# Patient Record
Sex: Male | Born: 1935 | Race: White | Hispanic: No | Marital: Married | Smoking: Former smoker
Health system: Southern US, Community
[De-identification: ages and names within clinical notes are randomized; demographics above are authoritative.]

## PROBLEM LIST (undated history)

## (undated) DIAGNOSIS — Z7901 Long term (current) use of anticoagulants: Secondary | ICD-10-CM

## (undated) DIAGNOSIS — I10 Essential (primary) hypertension: Secondary | ICD-10-CM

## (undated) DIAGNOSIS — E785 Hyperlipidemia, unspecified: Secondary | ICD-10-CM

## (undated) DIAGNOSIS — I480 Paroxysmal atrial fibrillation: Secondary | ICD-10-CM

## (undated) DIAGNOSIS — I447 Left bundle-branch block, unspecified: Secondary | ICD-10-CM

## (undated) DIAGNOSIS — N400 Enlarged prostate without lower urinary tract symptoms: Secondary | ICD-10-CM

## (undated) DIAGNOSIS — Z8719 Personal history of other diseases of the digestive system: Secondary | ICD-10-CM

## (undated) DIAGNOSIS — G5600 Carpal tunnel syndrome, unspecified upper limb: Secondary | ICD-10-CM

## (undated) DIAGNOSIS — N481 Balanitis: Secondary | ICD-10-CM

## (undated) DIAGNOSIS — Z8679 Personal history of other diseases of the circulatory system: Secondary | ICD-10-CM

## (undated) HISTORY — DX: Left bundle-branch block, unspecified: I44.7

## (undated) HISTORY — DX: Carpal tunnel syndrome, unspecified upper limb: G56.00

## (undated) HISTORY — DX: Personal history of other diseases of the circulatory system: Z86.79

## (undated) HISTORY — DX: Balanitis: N48.1

## (undated) HISTORY — DX: Long term (current) use of anticoagulants: Z79.01

## (undated) HISTORY — DX: Benign prostatic hyperplasia without lower urinary tract symptoms: N40.0

## (undated) HISTORY — DX: Paroxysmal atrial fibrillation: I48.0

## (undated) HISTORY — DX: Personal history of other diseases of the digestive system: Z87.19

## (undated) HISTORY — DX: Essential (primary) hypertension: I10

## (undated) HISTORY — PX: CARPAL TUNNEL RELEASE: SHX101

## (undated) HISTORY — DX: Hyperlipidemia, unspecified: E78.5

---

## 1998-09-25 ENCOUNTER — Other Ambulatory Visit: Admission: RE | Admit: 1998-09-25 | Discharge: 1998-09-25 | Payer: Self-pay | Admitting: Urology

## 1999-08-24 ENCOUNTER — Emergency Department (HOSPITAL_COMMUNITY): Admission: EM | Admit: 1999-08-24 | Discharge: 1999-08-24 | Payer: Self-pay | Admitting: Emergency Medicine

## 2000-08-02 ENCOUNTER — Encounter: Payer: Self-pay | Admitting: Emergency Medicine

## 2000-08-02 ENCOUNTER — Emergency Department (HOSPITAL_COMMUNITY): Admission: EM | Admit: 2000-08-02 | Discharge: 2000-08-02 | Payer: Self-pay | Admitting: Emergency Medicine

## 2002-07-25 ENCOUNTER — Encounter: Payer: Self-pay | Admitting: Internal Medicine

## 2002-07-25 ENCOUNTER — Encounter: Admission: RE | Admit: 2002-07-25 | Discharge: 2002-07-25 | Payer: Self-pay | Admitting: Internal Medicine

## 2002-07-26 HISTORY — PX: LUMBAR LAMINECTOMY/DECOMPRESSION MICRODISCECTOMY: SHX5026

## 2002-10-01 LAB — HM COLONOSCOPY

## 2002-10-08 ENCOUNTER — Encounter: Payer: Self-pay | Admitting: Neurosurgery

## 2002-10-12 ENCOUNTER — Inpatient Hospital Stay (HOSPITAL_COMMUNITY): Admission: RE | Admit: 2002-10-12 | Discharge: 2002-10-15 | Payer: Self-pay | Admitting: Neurosurgery

## 2002-10-12 ENCOUNTER — Encounter: Payer: Self-pay | Admitting: Neurosurgery

## 2002-12-04 ENCOUNTER — Encounter: Admission: RE | Admit: 2002-12-04 | Discharge: 2002-12-04 | Payer: Self-pay | Admitting: Neurosurgery

## 2002-12-04 ENCOUNTER — Encounter: Payer: Self-pay | Admitting: Neurosurgery

## 2003-03-05 ENCOUNTER — Encounter: Payer: Self-pay | Admitting: Neurosurgery

## 2003-03-05 ENCOUNTER — Encounter: Admission: RE | Admit: 2003-03-05 | Discharge: 2003-03-05 | Payer: Self-pay | Admitting: Neurosurgery

## 2003-09-03 ENCOUNTER — Encounter: Admission: RE | Admit: 2003-09-03 | Discharge: 2003-09-03 | Payer: Self-pay | Admitting: Neurosurgery

## 2004-06-04 ENCOUNTER — Ambulatory Visit: Payer: Self-pay | Admitting: Cardiology

## 2004-06-10 ENCOUNTER — Ambulatory Visit: Payer: Self-pay | Admitting: Cardiovascular Disease

## 2004-07-01 ENCOUNTER — Ambulatory Visit: Payer: Self-pay | Admitting: *Deleted

## 2004-07-09 ENCOUNTER — Ambulatory Visit: Payer: Self-pay | Admitting: *Deleted

## 2004-07-23 ENCOUNTER — Ambulatory Visit: Payer: Self-pay | Admitting: Cardiovascular Disease

## 2004-07-26 HISTORY — PX: CIRCUMCISION: SHX1350

## 2004-07-29 ENCOUNTER — Ambulatory Visit: Payer: Self-pay | Admitting: *Deleted

## 2004-08-07 ENCOUNTER — Ambulatory Visit: Payer: Self-pay | Admitting: Cardiovascular Disease

## 2004-08-26 ENCOUNTER — Ambulatory Visit: Payer: Self-pay | Admitting: Cardiology

## 2004-09-10 ENCOUNTER — Ambulatory Visit: Payer: Self-pay | Admitting: Internal Medicine

## 2004-09-23 ENCOUNTER — Ambulatory Visit: Payer: Self-pay | Admitting: Cardiology

## 2004-10-15 ENCOUNTER — Ambulatory Visit: Payer: Self-pay | Admitting: Internal Medicine

## 2004-10-21 ENCOUNTER — Ambulatory Visit: Payer: Self-pay | Admitting: *Deleted

## 2004-11-18 ENCOUNTER — Ambulatory Visit: Payer: Self-pay | Admitting: Cardiology

## 2004-12-16 ENCOUNTER — Ambulatory Visit: Payer: Self-pay | Admitting: *Deleted

## 2004-12-22 ENCOUNTER — Ambulatory Visit: Payer: Self-pay | Admitting: Cardiology

## 2005-01-07 ENCOUNTER — Encounter: Admission: RE | Admit: 2005-01-07 | Discharge: 2005-01-07 | Payer: Self-pay | Admitting: Urology

## 2005-01-12 ENCOUNTER — Ambulatory Visit (HOSPITAL_COMMUNITY): Admission: RE | Admit: 2005-01-12 | Discharge: 2005-01-12 | Payer: Self-pay | Admitting: Urology

## 2005-01-12 ENCOUNTER — Ambulatory Visit (HOSPITAL_BASED_OUTPATIENT_CLINIC_OR_DEPARTMENT_OTHER): Admission: RE | Admit: 2005-01-12 | Discharge: 2005-01-12 | Payer: Self-pay | Admitting: Urology

## 2005-01-20 ENCOUNTER — Ambulatory Visit: Payer: Self-pay | Admitting: Cardiovascular Disease

## 2005-02-03 ENCOUNTER — Ambulatory Visit: Payer: Self-pay | Admitting: Cardiology

## 2005-03-03 ENCOUNTER — Ambulatory Visit: Payer: Self-pay | Admitting: Cardiology

## 2005-03-31 ENCOUNTER — Ambulatory Visit: Payer: Self-pay | Admitting: Cardiovascular Disease

## 2005-04-14 ENCOUNTER — Ambulatory Visit: Payer: Self-pay | Admitting: Cardiology

## 2005-04-28 ENCOUNTER — Ambulatory Visit: Payer: Self-pay | Admitting: Cardiology

## 2005-05-26 ENCOUNTER — Ambulatory Visit: Payer: Self-pay | Admitting: Cardiology

## 2005-06-23 ENCOUNTER — Ambulatory Visit: Payer: Self-pay | Admitting: Cardiology

## 2005-06-25 ENCOUNTER — Ambulatory Visit: Payer: Self-pay | Admitting: Internal Medicine

## 2005-07-14 ENCOUNTER — Ambulatory Visit: Payer: Self-pay | Admitting: Cardiology

## 2005-08-11 ENCOUNTER — Ambulatory Visit: Payer: Self-pay | Admitting: Cardiovascular Disease

## 2005-08-16 ENCOUNTER — Ambulatory Visit: Payer: Self-pay | Admitting: Cardiology

## 2005-08-30 ENCOUNTER — Ambulatory Visit (HOSPITAL_COMMUNITY): Admission: RE | Admit: 2005-08-30 | Discharge: 2005-08-30 | Payer: Self-pay | Admitting: Neurosurgery

## 2005-09-08 ENCOUNTER — Ambulatory Visit: Payer: Self-pay | Admitting: Cardiology

## 2005-10-06 ENCOUNTER — Ambulatory Visit: Payer: Self-pay | Admitting: Cardiology

## 2005-11-01 ENCOUNTER — Ambulatory Visit: Payer: Self-pay | Admitting: Internal Medicine

## 2005-11-03 ENCOUNTER — Ambulatory Visit: Payer: Self-pay | Admitting: Cardiology

## 2005-11-04 ENCOUNTER — Ambulatory Visit: Payer: Self-pay | Admitting: Internal Medicine

## 2005-12-02 ENCOUNTER — Ambulatory Visit: Payer: Self-pay | Admitting: Cardiology

## 2005-12-16 ENCOUNTER — Ambulatory Visit: Payer: Self-pay | Admitting: Internal Medicine

## 2005-12-22 ENCOUNTER — Ambulatory Visit (HOSPITAL_COMMUNITY): Admission: RE | Admit: 2005-12-22 | Discharge: 2005-12-22 | Payer: Self-pay | Admitting: Neurosurgery

## 2005-12-28 ENCOUNTER — Ambulatory Visit: Payer: Self-pay | Admitting: Internal Medicine

## 2006-01-11 ENCOUNTER — Ambulatory Visit: Payer: Self-pay | Admitting: Cardiology

## 2006-02-01 ENCOUNTER — Ambulatory Visit: Payer: Self-pay | Admitting: *Deleted

## 2006-02-01 ENCOUNTER — Ambulatory Visit: Payer: Self-pay | Admitting: Cardiology

## 2006-02-15 ENCOUNTER — Ambulatory Visit: Payer: Self-pay | Admitting: *Deleted

## 2006-03-21 ENCOUNTER — Ambulatory Visit: Payer: Self-pay | Admitting: Cardiology

## 2006-04-05 ENCOUNTER — Ambulatory Visit: Payer: Self-pay | Admitting: Cardiology

## 2006-04-26 ENCOUNTER — Ambulatory Visit: Payer: Self-pay | Admitting: Cardiovascular Disease

## 2006-05-10 ENCOUNTER — Ambulatory Visit: Payer: Self-pay | Admitting: Cardiology

## 2006-05-31 ENCOUNTER — Ambulatory Visit: Payer: Self-pay | Admitting: Cardiology

## 2006-06-28 ENCOUNTER — Ambulatory Visit: Payer: Self-pay | Admitting: Internal Medicine

## 2006-07-07 ENCOUNTER — Encounter: Admission: RE | Admit: 2006-07-07 | Discharge: 2006-07-07 | Payer: Self-pay | Admitting: Family Medicine

## 2006-07-09 ENCOUNTER — Encounter: Admission: RE | Admit: 2006-07-09 | Discharge: 2006-07-09 | Payer: Self-pay | Admitting: Family Medicine

## 2006-07-22 ENCOUNTER — Ambulatory Visit: Payer: Self-pay | Admitting: Cardiology

## 2006-07-26 HISTORY — PX: KNEE ARTHROSCOPY: SHX127

## 2006-07-29 ENCOUNTER — Ambulatory Visit (HOSPITAL_COMMUNITY): Admission: RE | Admit: 2006-07-29 | Discharge: 2006-07-29 | Payer: Self-pay | Admitting: Orthopaedic Surgery

## 2006-08-08 ENCOUNTER — Ambulatory Visit: Payer: Self-pay | Admitting: Internal Medicine

## 2006-09-07 ENCOUNTER — Ambulatory Visit: Payer: Self-pay | Admitting: Cardiology

## 2006-09-28 ENCOUNTER — Ambulatory Visit: Payer: Self-pay | Admitting: Cardiology

## 2006-10-26 ENCOUNTER — Ambulatory Visit: Payer: Self-pay | Admitting: Cardiology

## 2006-11-07 ENCOUNTER — Ambulatory Visit: Payer: Self-pay | Admitting: Internal Medicine

## 2006-11-07 LAB — CONVERTED CEMR LAB
AST: 22 units/L (ref 0–37)
Bilirubin, Direct: 0.1 mg/dL (ref 0.0–0.3)
CO2: 32 meq/L (ref 19–32)
Chloride: 111 meq/L (ref 96–112)
Creatinine, Ser: 0.9 mg/dL (ref 0.4–1.5)
GFR calc non Af Amer: 86 mL/min
Glucose, Bld: 96 mg/dL (ref 70–99)
HDL: 31.9 mg/dL — ABNORMAL LOW (ref >39.0)
PSA: 2.46 ng/mL (ref 0.10–4.00)
Sodium: 147 meq/L — ABNORMAL HIGH (ref 135–145)
Total Bilirubin: 0.6 mg/dL (ref 0.3–1.2)
Total CHOL/HDL Ratio: 4.7
Total Protein: 6.9 g/dL (ref 6.0–8.3)
Triglycerides: 83 mg/dL (ref 0–149)
VLDL: 17 mg/dL (ref 0–40)

## 2006-11-25 ENCOUNTER — Ambulatory Visit: Payer: Self-pay | Admitting: Internal Medicine

## 2006-12-26 ENCOUNTER — Ambulatory Visit: Payer: Self-pay | Admitting: Internal Medicine

## 2007-01-24 ENCOUNTER — Ambulatory Visit: Payer: Self-pay | Admitting: Cardiology

## 2007-02-01 ENCOUNTER — Ambulatory Visit: Payer: Self-pay | Admitting: Cardiology

## 2007-02-01 LAB — CONVERTED CEMR LAB
Basophils Absolute: 0 10*3/uL (ref 0.0–0.1)
Eosinophils Absolute: 0.2 10*3/uL (ref 0.0–0.6)
HCT: 40.1 % (ref 39.0–52.0)
Hemoglobin: 14.3 g/dL (ref 13.0–17.0)
Lymphocytes Relative: 27.4 % (ref 12.0–46.0)
MCHC: 35.5 g/dL (ref 30.0–36.0)
Monocytes Absolute: 0.7 10*3/uL (ref 0.2–0.7)
Monocytes Relative: 12.3 % — ABNORMAL HIGH (ref 3.0–11.0)
Neutro Abs: 3.4 10*3/uL (ref 1.4–7.7)
Neutrophils Relative %: 57.4 % (ref 43.0–77.0)
RDW: 12.8 % (ref 11.5–14.6)

## 2007-02-24 ENCOUNTER — Ambulatory Visit: Payer: Self-pay | Admitting: Cardiology

## 2007-03-28 ENCOUNTER — Ambulatory Visit: Payer: Self-pay | Admitting: Internal Medicine

## 2007-04-26 ENCOUNTER — Ambulatory Visit: Payer: Self-pay | Admitting: Cardiology

## 2007-05-09 ENCOUNTER — Ambulatory Visit: Payer: Self-pay | Admitting: Internal Medicine

## 2007-05-23 ENCOUNTER — Encounter: Payer: Self-pay | Admitting: Internal Medicine

## 2007-05-23 DIAGNOSIS — I495 Sick sinus syndrome: Secondary | ICD-10-CM

## 2007-05-23 DIAGNOSIS — K5730 Diverticulosis of large intestine without perforation or abscess without bleeding: Secondary | ICD-10-CM | POA: Insufficient documentation

## 2007-05-23 DIAGNOSIS — I4891 Unspecified atrial fibrillation: Secondary | ICD-10-CM | POA: Insufficient documentation

## 2007-05-23 DIAGNOSIS — I10 Essential (primary) hypertension: Secondary | ICD-10-CM | POA: Insufficient documentation

## 2007-05-24 ENCOUNTER — Ambulatory Visit: Payer: Self-pay | Admitting: Cardiology

## 2007-06-21 ENCOUNTER — Ambulatory Visit: Payer: Self-pay | Admitting: Cardiology

## 2007-06-27 ENCOUNTER — Ambulatory Visit: Payer: Self-pay | Admitting: Internal Medicine

## 2007-06-27 DIAGNOSIS — K625 Hemorrhage of anus and rectum: Secondary | ICD-10-CM

## 2007-07-18 ENCOUNTER — Ambulatory Visit: Payer: Self-pay | Admitting: Cardiology

## 2007-08-15 ENCOUNTER — Ambulatory Visit: Payer: Self-pay | Admitting: Cardiology

## 2007-09-12 ENCOUNTER — Ambulatory Visit: Payer: Self-pay | Admitting: Cardiovascular Disease

## 2007-10-05 ENCOUNTER — Ambulatory Visit: Payer: Self-pay | Admitting: Cardiology

## 2007-10-27 ENCOUNTER — Ambulatory Visit: Payer: Self-pay | Admitting: Internal Medicine

## 2007-11-13 ENCOUNTER — Ambulatory Visit: Payer: Self-pay | Admitting: Internal Medicine

## 2007-11-13 LAB — CONVERTED CEMR LAB
AST: 24 units/L (ref 0–37)
Albumin: 3.7 g/dL (ref 3.5–5.2)
Alkaline Phosphatase: 48 units/L (ref 39–117)
BUN: 18 mg/dL (ref 6–23)
Basophils Absolute: 0 10*3/uL (ref 0.0–0.1)
Chloride: 112 meq/L (ref 96–112)
Cholesterol: 168 mg/dL (ref 0–200)
Creatinine, Ser: 0.9 mg/dL (ref 0.4–1.5)
Eosinophils Absolute: 0.2 10*3/uL (ref 0.0–0.7)
Glucose, Bld: 95 mg/dL (ref 70–99)
HCT: 41.3 % (ref 39.0–52.0)
HDL: 24.6 mg/dL — ABNORMAL LOW (ref >39.0)
MCHC: 33.7 g/dL (ref 30.0–36.0)
Monocytes Absolute: 0.5 10*3/uL (ref 0.1–1.0)
Monocytes Relative: 11.6 % (ref 3.0–12.0)
Neutro Abs: 2.6 10*3/uL (ref 1.4–7.7)
PSA: 3.05 ng/mL (ref 0.10–4.00)
Platelets: 146 10*3/uL — ABNORMAL LOW (ref 150–400)
Potassium: 4.5 meq/L (ref 3.5–5.1)
RDW: 12.9 % (ref 11.5–14.6)
Sodium: 146 meq/L — ABNORMAL HIGH (ref 135–145)
Total CHOL/HDL Ratio: 6.8
Total Protein: 6.3 g/dL (ref 6.0–8.3)
Triglycerides: 98 mg/dL (ref 0–149)

## 2007-11-24 ENCOUNTER — Ambulatory Visit: Payer: Self-pay | Admitting: Cardiology

## 2007-12-25 ENCOUNTER — Ambulatory Visit: Payer: Self-pay | Admitting: Cardiology

## 2008-01-24 ENCOUNTER — Ambulatory Visit: Payer: Self-pay | Admitting: Cardiology

## 2008-01-25 ENCOUNTER — Ambulatory Visit: Payer: Self-pay | Admitting: Cardiology

## 2008-02-27 ENCOUNTER — Ambulatory Visit: Payer: Self-pay | Admitting: Cardiology

## 2008-04-02 ENCOUNTER — Ambulatory Visit: Payer: Self-pay | Admitting: Cardiology

## 2008-04-16 ENCOUNTER — Ambulatory Visit: Payer: Self-pay | Admitting: Internal Medicine

## 2008-04-30 ENCOUNTER — Ambulatory Visit: Payer: Self-pay | Admitting: Cardiology

## 2008-05-28 ENCOUNTER — Ambulatory Visit: Payer: Self-pay | Admitting: Internal Medicine

## 2008-06-12 ENCOUNTER — Ambulatory Visit: Payer: Self-pay | Admitting: Internal Medicine

## 2008-06-12 DIAGNOSIS — L259 Unspecified contact dermatitis, unspecified cause: Secondary | ICD-10-CM | POA: Insufficient documentation

## 2008-06-19 ENCOUNTER — Ambulatory Visit: Payer: Self-pay | Admitting: Cardiovascular Disease

## 2008-06-21 ENCOUNTER — Ambulatory Visit: Payer: Self-pay | Admitting: Internal Medicine

## 2008-07-17 ENCOUNTER — Ambulatory Visit: Payer: Self-pay | Admitting: Internal Medicine

## 2008-07-17 ENCOUNTER — Ambulatory Visit: Payer: Self-pay | Admitting: Cardiology

## 2008-08-14 ENCOUNTER — Ambulatory Visit: Payer: Self-pay | Admitting: Cardiovascular Disease

## 2008-08-22 ENCOUNTER — Ambulatory Visit: Payer: Self-pay | Admitting: Internal Medicine

## 2008-08-22 LAB — CONVERTED CEMR LAB
Chloride: 108 meq/L (ref 96–112)
GFR calc Af Amer: 104 mL/min
GFR calc non Af Amer: 86 mL/min
Potassium: 4.7 meq/L (ref 3.5–5.1)
Sodium: 142 meq/L (ref 135–145)

## 2008-09-03 ENCOUNTER — Ambulatory Visit: Payer: Self-pay | Admitting: Internal Medicine

## 2008-09-11 ENCOUNTER — Ambulatory Visit: Payer: Self-pay | Admitting: Cardiology

## 2008-09-11 ENCOUNTER — Ambulatory Visit: Payer: Self-pay | Admitting: Internal Medicine

## 2008-09-16 ENCOUNTER — Encounter: Payer: Self-pay | Admitting: Internal Medicine

## 2008-09-16 DIAGNOSIS — D696 Thrombocytopenia, unspecified: Secondary | ICD-10-CM

## 2008-09-16 LAB — CONVERTED CEMR LAB
Bilirubin, Direct: 0.2 mg/dL (ref 0.0–0.3)
Eosinophils Relative: 3.2 % (ref 0.0–5.0)
HCT: 42.9 % (ref 39.0–52.0)
HDL: 26.9 mg/dL — ABNORMAL LOW (ref >39.0)
Lymphocytes Relative: 26 % (ref 12.0–46.0)
TSH: 4.9 microintl units/mL (ref 0.35–5.50)
Total Bilirubin: 0.8 mg/dL (ref 0.3–1.2)
Triglycerides: 124 mg/dL (ref 0–149)
WBC: 5.9 10*3/uL (ref 4.5–10.5)

## 2008-09-20 ENCOUNTER — Ambulatory Visit (HOSPITAL_BASED_OUTPATIENT_CLINIC_OR_DEPARTMENT_OTHER): Admission: RE | Admit: 2008-09-20 | Discharge: 2008-09-20 | Payer: Self-pay | Admitting: Internal Medicine

## 2008-09-20 ENCOUNTER — Ambulatory Visit: Payer: Self-pay | Admitting: Diagnostic Radiology

## 2008-09-20 ENCOUNTER — Ambulatory Visit: Payer: Self-pay | Admitting: Internal Medicine

## 2008-09-20 DIAGNOSIS — M25559 Pain in unspecified hip: Secondary | ICD-10-CM

## 2008-10-09 ENCOUNTER — Ambulatory Visit: Payer: Self-pay | Admitting: Cardiology

## 2008-11-06 ENCOUNTER — Ambulatory Visit: Payer: Self-pay | Admitting: Internal Medicine

## 2008-11-06 ENCOUNTER — Ambulatory Visit: Payer: Self-pay | Admitting: Cardiovascular Disease

## 2008-11-06 LAB — CONVERTED CEMR LAB
Basophils Relative: 1.1 % (ref 0.0–3.0)
Eosinophils Relative: 2.4 % (ref 0.0–5.0)
HCT: 41.9 % (ref 39.0–52.0)
Lymphs Abs: 1.5 10*3/uL (ref 0.7–4.0)
Monocytes Absolute: 0.6 10*3/uL (ref 0.1–1.0)
Neutrophils Relative %: 61.7 % (ref 43.0–77.0)
WBC: 6 10*3/uL (ref 4.5–10.5)

## 2008-11-11 ENCOUNTER — Telehealth: Payer: Self-pay | Admitting: Internal Medicine

## 2008-11-27 ENCOUNTER — Ambulatory Visit: Payer: Self-pay | Admitting: Cardiology

## 2008-12-09 ENCOUNTER — Encounter (INDEPENDENT_AMBULATORY_CARE_PROVIDER_SITE_OTHER): Payer: Self-pay | Admitting: *Deleted

## 2008-12-24 ENCOUNTER — Encounter: Payer: Self-pay | Admitting: *Deleted

## 2008-12-25 ENCOUNTER — Ambulatory Visit: Payer: Self-pay | Admitting: Cardiology

## 2008-12-25 LAB — CONVERTED CEMR LAB
POC INR: 1.7
Protime: 16.2

## 2009-01-23 ENCOUNTER — Ambulatory Visit: Payer: Self-pay | Admitting: Cardiology

## 2009-01-23 ENCOUNTER — Ambulatory Visit: Payer: Self-pay | Admitting: Internal Medicine

## 2009-01-29 ENCOUNTER — Encounter: Payer: Self-pay | Admitting: *Deleted

## 2009-02-05 ENCOUNTER — Encounter (INDEPENDENT_AMBULATORY_CARE_PROVIDER_SITE_OTHER): Payer: Self-pay | Admitting: Cardiology

## 2009-02-05 ENCOUNTER — Ambulatory Visit: Payer: Self-pay

## 2009-02-05 ENCOUNTER — Ambulatory Visit: Payer: Self-pay | Admitting: Cardiology

## 2009-02-05 ENCOUNTER — Encounter: Payer: Self-pay | Admitting: Cardiology

## 2009-02-05 LAB — CONVERTED CEMR LAB: Prothrombin Time: 18.8 s

## 2009-03-04 ENCOUNTER — Ambulatory Visit: Payer: Self-pay | Admitting: Internal Medicine

## 2009-03-04 LAB — CONVERTED CEMR LAB: POC INR: 2.9

## 2009-03-05 ENCOUNTER — Ambulatory Visit: Payer: Self-pay | Admitting: Internal Medicine

## 2009-03-05 DIAGNOSIS — E785 Hyperlipidemia, unspecified: Secondary | ICD-10-CM | POA: Insufficient documentation

## 2009-03-06 ENCOUNTER — Ambulatory Visit: Payer: Self-pay | Admitting: Cardiology

## 2009-03-07 ENCOUNTER — Telehealth: Payer: Self-pay | Admitting: Internal Medicine

## 2009-03-07 LAB — CONVERTED CEMR LAB
BUN: 16 mg/dL (ref 6–23)
Basophils Absolute: 0 10*3/uL (ref 0.0–0.1)
Basophils Relative: 0.3 % (ref 0.0–3.0)
Eosinophils Absolute: 0.2 10*3/uL (ref 0.0–0.7)
GFR calc non Af Amer: 98.21 mL/min (ref >60)
Glucose, Bld: 87 mg/dL (ref 70–99)
LDL Cholesterol: 122 mg/dL — ABNORMAL HIGH (ref 0–99)
Lymphocytes Relative: 29.7 % (ref 12.0–46.0)
MCHC: 34.5 g/dL (ref 30.0–36.0)
MCV: 93.4 fL (ref 78.0–100.0)
Monocytes Absolute: 0.4 10*3/uL (ref 0.1–1.0)
Neutrophils Relative %: 60.3 % (ref 43.0–77.0)
Platelets: 150 10*3/uL (ref 150.0–400.0)
Potassium: 4.1 meq/L (ref 3.5–5.1)
RBC: 4.33 M/uL (ref 4.22–5.81)
VLDL: 19.2 mg/dL (ref 0.0–40.0)

## 2009-03-10 ENCOUNTER — Telehealth: Payer: Self-pay | Admitting: Internal Medicine

## 2009-03-10 ENCOUNTER — Encounter: Payer: Self-pay | Admitting: Internal Medicine

## 2009-04-01 ENCOUNTER — Ambulatory Visit: Payer: Self-pay | Admitting: Cardiology

## 2009-04-01 LAB — CONVERTED CEMR LAB: POC INR: 2.8

## 2009-04-29 ENCOUNTER — Ambulatory Visit: Payer: Self-pay | Admitting: Cardiology

## 2009-04-29 LAB — CONVERTED CEMR LAB: POC INR: 2.2

## 2009-05-26 ENCOUNTER — Telehealth (INDEPENDENT_AMBULATORY_CARE_PROVIDER_SITE_OTHER): Payer: Self-pay | Admitting: *Deleted

## 2009-05-27 ENCOUNTER — Ambulatory Visit: Payer: Self-pay | Admitting: Cardiology

## 2009-05-27 LAB — CONVERTED CEMR LAB: POC INR: 2.3

## 2009-06-26 ENCOUNTER — Ambulatory Visit: Payer: Self-pay | Admitting: Cardiology

## 2009-07-28 ENCOUNTER — Encounter (INDEPENDENT_AMBULATORY_CARE_PROVIDER_SITE_OTHER): Payer: Self-pay | Admitting: Cardiology

## 2009-07-28 ENCOUNTER — Ambulatory Visit: Payer: Self-pay | Admitting: Cardiology

## 2009-07-28 LAB — CONVERTED CEMR LAB: POC INR: 1.4

## 2009-08-19 ENCOUNTER — Ambulatory Visit: Payer: Self-pay | Admitting: Internal Medicine

## 2009-08-19 LAB — CONVERTED CEMR LAB
BUN: 18 mg/dL (ref 6–23)
Basophils Absolute: 0 10*3/uL (ref 0.0–0.1)
Calcium: 9.3 mg/dL (ref 8.4–10.5)
Creatinine, Ser: 0.9 mg/dL (ref 0.4–1.5)
Eosinophils Absolute: 0.2 10*3/uL (ref 0.0–0.7)
GFR calc non Af Amer: 85.64 mL/min (ref >60)
Glucose, Bld: 69 mg/dL — ABNORMAL LOW (ref 70–99)
HCT: 39.3 % (ref 39.0–52.0)
Hemoglobin: 13.1 g/dL (ref 13.0–17.0)
LDL Cholesterol: 110 mg/dL — ABNORMAL HIGH (ref 0–99)
Lymphocytes Relative: 29 % (ref 12.0–46.0)
Lymphs Abs: 1.4 10*3/uL (ref 0.7–4.0)
MCHC: 33.3 g/dL (ref 30.0–36.0)
Neutro Abs: 2.8 10*3/uL (ref 1.4–7.7)
RDW: 13 % (ref 11.5–14.6)
Triglycerides: 62 mg/dL (ref 0.0–149.0)

## 2009-08-26 ENCOUNTER — Ambulatory Visit: Payer: Self-pay | Admitting: Internal Medicine

## 2009-09-04 ENCOUNTER — Ambulatory Visit: Payer: Self-pay | Admitting: Internal Medicine

## 2009-09-09 ENCOUNTER — Ambulatory Visit: Payer: Self-pay | Admitting: Internal Medicine

## 2009-10-09 ENCOUNTER — Ambulatory Visit: Payer: Self-pay | Admitting: Internal Medicine

## 2009-10-09 ENCOUNTER — Ambulatory Visit: Payer: Self-pay | Admitting: Cardiology

## 2009-10-13 ENCOUNTER — Encounter: Payer: Self-pay | Admitting: Internal Medicine

## 2009-11-11 ENCOUNTER — Ambulatory Visit: Payer: Self-pay | Admitting: Cardiovascular Disease

## 2009-11-11 LAB — CONVERTED CEMR LAB: POC INR: 1.9

## 2009-12-09 ENCOUNTER — Ambulatory Visit: Payer: Self-pay | Admitting: Cardiology

## 2009-12-09 LAB — CONVERTED CEMR LAB: POC INR: 2.2

## 2009-12-26 ENCOUNTER — Encounter: Payer: Self-pay | Admitting: Internal Medicine

## 2010-01-12 ENCOUNTER — Ambulatory Visit: Payer: Self-pay | Admitting: Cardiology

## 2010-01-12 LAB — CONVERTED CEMR LAB: POC INR: 2.1

## 2010-01-19 ENCOUNTER — Encounter: Payer: Self-pay | Admitting: Internal Medicine

## 2010-02-09 ENCOUNTER — Ambulatory Visit: Payer: Self-pay | Admitting: Cardiovascular Disease

## 2010-03-09 ENCOUNTER — Ambulatory Visit: Payer: Self-pay | Admitting: Cardiology

## 2010-03-09 LAB — CONVERTED CEMR LAB: POC INR: 2.1

## 2010-04-01 ENCOUNTER — Ambulatory Visit: Payer: Self-pay | Admitting: Internal Medicine

## 2010-04-06 ENCOUNTER — Ambulatory Visit: Payer: Self-pay | Admitting: Cardiovascular Disease

## 2010-04-06 LAB — CONVERTED CEMR LAB: POC INR: 2.4

## 2010-05-04 ENCOUNTER — Ambulatory Visit: Payer: Self-pay | Admitting: Internal Medicine

## 2010-05-04 LAB — CONVERTED CEMR LAB: POC INR: 1.9

## 2010-06-01 ENCOUNTER — Ambulatory Visit: Payer: Self-pay | Admitting: Cardiovascular Disease

## 2010-07-29 ENCOUNTER — Ambulatory Visit: Admission: RE | Admit: 2010-07-29 | Discharge: 2010-07-29 | Payer: Self-pay | Source: Home / Self Care

## 2010-07-29 LAB — CONVERTED CEMR LAB: POC INR: 2.5

## 2010-08-25 NOTE — Medication Information (Signed)
Summary: rov/ewj  Anticoagulant Therapy  Managed by: Eda Keys, PharmD Referring MD: Olga Millers MD PCP: Dondra Spry DO Supervising MD: Graciela Husbands MD, Viviann Spare Indication 1: Atrial Fibrillation (ICD-427.31) Lab Used: [facility] Site: [facility] INR POC 2.3 INR RANGE 2 - 3  Dietary changes: yes       Details: Pt has cut back on greens,   Health status changes: no    Bleeding/hemorrhagic complications: no    Recent/future hospitalizations: no    Any changes in medication regimen? no    Recent/future dental: no  Any missed doses?: no       Is patient compliant with meds? yes       Current Medications (verified): 1)  Cardizem 30 Mg  Tabs (Diltiazem Hcl) .... 2 By Mouth Two Times A Day 2)  Fish Oil 1000 Mg  Caps (Omega-3 Fatty Acids) .... By Mouth Once Daily 3)  Proscar 5 Mg  Tabs (Finasteride) .... One By Mouth Once Daily 4)  Multivitamins   Tabs (Multiple Vitamin) .... One By Mouth Once Daily 5)  Vitamin C 500 Mg  Tabs (Ascorbic Acid) .Marland Kitchen.. 1-2 By Mouth Once Daily 6)  Ocuvite  Tabs (Multiple Vitamins-Minerals) .... Take 1 Tablet By Mouth Once A Day 7)  Lisinopril 5 Mg Tabs (Lisinopril) .... One By Mouth Qam 8)  Vitamin D3 1000 Unit Tabs (Cholecalciferol) .... Take 1 Tablet By Mouth Once A Day 9)  B Complex 100  Tabs (B Complex Vitamins) .... Take 1 Tablet By Mouth Once A Day 10)  Coumadin 5 Mg Tabs (Warfarin Sodium) .... Take As Directed By Coumadin Clinic. 11)  Zostavax [id number] Unt/0.20ml Solr (Zoster Vaccine Live) .... Administer Vaccine X 1  Allergies (verified): 1)  ! Pcn  Anticoagulation Management History:      The patient is taking warfarin and comes in today for a routine follow up visit.  Positive risk factors for bleeding include an age of 64 years or older.  The bleeding index is 'intermediate risk'.  Positive CHADS2 values include History of HTN and Age > 82 years old.  The start date was 08/10/2000.  Anticoagulation responsible provider: Graciela Husbands MD, Viviann Spare.   INR POC: 2.3.  Cuvette Lot#: [id number].  Exp: 10/2010.    Anticoagulation Management Assessment/Plan:      The patient's current anticoagulation dose is Coumadin 5 mg tabs: Take as directed by coumadin clinic..  The target INR is 2 - 3.  The next INR is due 10/09/2009.  Anticoagulation instructions were given to patient.  Results were reviewed/authorized by Eda Keys, PharmD.  He was notified by Eda Keys.         Prior Anticoagulation Instructions: INR 1.5  Take 2 tablets today, then resume 1.5 tablets daily.  Recheck in 2 weeks.    Current Anticoagulation Instructions: INR 2.3  Continue current dosing schedule.  Take 1.5 tablets (7.5 mg) every day.  Return to clinic in 4 weeks.

## 2010-08-25 NOTE — Medication Information (Signed)
Summary: Geoffrey Romero  Anticoagulant Therapy  Managed by: Bethena Midget, RN, BSN Referring MD: Olga Millers MD PCP: Dondra Spry DO Supervising MD: Myrtis Ser MD, Tinnie Gens Indication 1: Atrial Fibrillation (ICD-427.31) Lab Used: [facility] Site: [facility] INR POC 2.1 INR RANGE 2 - 3  Dietary changes: no    Health status changes: no    Bleeding/hemorrhagic complications: yes       Details: Pt states he had some hematuria approx. 8 days ago. UA done at Dr Vonita Moss office.  Recent/future hospitalizations: no    Any changes in medication regimen? no    Recent/future dental: no  Any missed doses?: no       Is patient compliant with meds? yes       Allergies: 1)  ! Pcn  Anticoagulation Management History:      The patient is taking warfarin and comes in today for a routine follow up visit.  Positive risk factors for bleeding include an age of 75 years or older.  The bleeding index is 'intermediate risk'.  Positive CHADS2 values include History of HTN and Age > 7 years old.  The start date was 08/10/2000.  Anticoagulation responsible provider: Myrtis Ser MD, Tinnie Gens.  INR POC: 2.1.  Cuvette Lot#: [id number].  Exp: 02/2011.    Anticoagulation Management Assessment/Plan:      The patient's current anticoagulation dose is Coumadin 5 mg tabs: Take as directed by coumadin clinic..  The target INR is 2 - 3.  The next INR is due 02/09/2010.  Anticoagulation instructions were given to patient.  Results were reviewed/authorized by Bethena Midget, RN, BSN.  He was notified by Bethena Midget, RN, BSN.         Prior Anticoagulation Instructions: INR 2.2  Continue taking 1.5 tablets every day.   Return to clinic in 4 weeks.    Current Anticoagulation Instructions: INR 2.1 Continue 7.5mg s daily. Recheck in 4 weeks.

## 2010-08-25 NOTE — Medication Information (Signed)
Summary: rov/sp  Anticoagulant Therapy  Managed by: Bethena Midget, RN, BSN Referring MD: Olga Millers MD PCP: Dondra Spry DO Supervising MD: Myrtis Ser MD, Tinnie Gens Indication 1: Atrial Fibrillation (ICD-427.31) Lab Used: [facility] Site: [facility] INR POC 2.1 INR RANGE 2 - 3  Dietary changes: no    Health status changes: no    Bleeding/hemorrhagic complications: no    Recent/future hospitalizations: no    Any changes in medication regimen? no    Recent/future dental: no  Any missed doses?: no       Is patient compliant with meds? yes       Allergies: 1)  ! Pcn  Anticoagulation Management History:      The patient is taking warfarin and comes in today for a routine follow up visit.  Positive risk factors for bleeding include an age of 75 years or older.  The bleeding index is 'intermediate risk'.  Positive CHADS2 values include History of HTN and Age > 51 years old.  The start date was 08/10/2000.  Anticoagulation responsible provider: Myrtis Ser MD, Tinnie Gens.  INR POC: 2.1.  Cuvette Lot#: [id number].  Exp: 04/2011.    Anticoagulation Management Assessment/Plan:      The patient's current anticoagulation dose is Coumadin 5 mg tabs: Take as directed by coumadin clinic..  The target INR is 2 - 3.  The next INR is due 04/06/2010.  Anticoagulation instructions were given to patient.  Results were reviewed/authorized by Bethena Midget, RN, BSN.  He was notified by Bethena Midget, RN, BSN.         Prior Anticoagulation Instructions: INR 2.3  Continue same dose of 1 1/2 tablets every day.   Current Anticoagulation Instructions: INR 2.1 Continue 7.5mg s everyday. Recheck in 4 weeks.

## 2010-08-25 NOTE — Letter (Signed)
Summary: Handout Printed  Printed Handout:  - Coumadin Instructions-w/out Meds 

## 2010-08-25 NOTE — Medication Information (Signed)
Summary: rovj.mp  Anticoagulant Therapy  Managed by: Cloyde Reams, RN, BSN Referring MD: Olga Millers MD PCP: Dondra Spry DO Supervising MD: Gala Romney MD, Reuel Boom Indication 1: Atrial Fibrillation (ICD-427.31) Lab Used: [facility] Site: [facility] INR POC 1.5 INR RANGE 2 - 3  Dietary changes: no    Health status changes: no    Bleeding/hemorrhagic complications: no    Recent/future hospitalizations: no    Any changes in medication regimen? no    Recent/future dental: no  Any missed doses?: yes     Details: May have missed 1 dose last  Is patient compliant with meds? yes      Comments: Recommended pt return 1 week, pt refused requests 1 month return.  Allergies (verified): 1)  ! Pcn  Anticoagulation Management History:      The patient is taking warfarin and comes in today for a routine follow up visit.  Positive risk factors for bleeding include an age of 75 years or older.  The bleeding index is 'intermediate risk'.  Positive CHADS2 values include History of HTN and Age > 75 years old.  The start date was 08/10/2000.  Anticoagulation responsible provider: Leocadia Idleman MD, Reuel Boom.  INR POC: 1.5.  Cuvette Lot#: [id number].  Exp: 10/2010.    Anticoagulation Management Assessment/Plan:      The patient's current anticoagulation dose is Coumadin 5 mg tabs: Take as directed by coumadin clinic..  The target INR is 2 - 3.  The next INR is due 09/09/2009.  Anticoagulation instructions were given to patient.  Results were reviewed/authorized by Cloyde Reams, RN, BSN.  He was notified by Cloyde Reams RN.         Prior Anticoagulation Instructions: INR 1.4  Take 2 tabs today then take 1.5 tabs every day.    Recheck in 4 weeks per patient request.    Current Anticoagulation Instructions: INR 1.5  Take 2 tablets today, then resume 1.5 tablets daily.  Recheck in 2 weeks.

## 2010-08-25 NOTE — Medication Information (Signed)
Summary: rov/tm   Anticoagulant Therapy  Managed by: Rolland Porter, PharmD Referring MD: Olga Millers MD PCP: Dondra Spry DO Supervising MD: Eden Emms MD, Theron Arista Indication 1: Atrial Fibrillation (ICD-427.31) Lab Used: [facility] Site: [facility] INR POC 2.4 INR RANGE 2 - 3  Dietary changes: no    Health status changes: no    Bleeding/hemorrhagic complications: no    Recent/future hospitalizations: no    Any changes in medication regimen? no    Recent/future dental: no  Any missed doses?: no       Is patient compliant with meds? yes       Allergies: 1)  ! Pcn  Anticoagulation Management History:      Positive risk factors for bleeding include an age of 60 years or older.  The bleeding index is 'intermediate risk'.  Positive CHADS2 values include History of HTN and Age > 68 years old.  The start date was 08/10/2000.  Anticoagulation responsible provider: Eden Emms MD, Theron Arista.  INR POC: 2.4.  Cuvette Lot#: [id number].  Exp: 05/2011.    Anticoagulation Management Assessment/Plan:      The patient's current anticoagulation dose is Coumadin 5 mg tabs: Take as directed by coumadin clinic..  The target INR is 2 - 3.  The next INR is due 05/04/2010.  Anticoagulation instructions were given to patient.  Results were reviewed/authorized by Rolland Porter, PharmD.  He was notified by Kennieth Francois.         Prior Anticoagulation Instructions: INR 2.1 Continue 7.5mg s everyday. Recheck in 4 weeks.   Current Anticoagulation Instructions: INR 2.4  Continue taking one and one-half tablets every day.  We will see you in four weeks.

## 2010-08-25 NOTE — Assessment & Plan Note (Signed)
Summary: 6 MON F/U/HEA   Vital Signs:  Patient profile:   77 year old male Weight:      167.25 pounds O2 Sat:      98 % Temp:     97.6 degrees F Pulse rate:   66 / minute BP sitting:   130 / 76  Primary Care Provider:  Dondra Spry DO   History of Present Illness:  Hypertension Follow-Up      This is an 77 year old man who presents for Hypertension follow-up.  The patient denies lightheadedness and headaches.  The patient denies the following associated symptoms: chest pain.  Compliance with medications (by patient report) has been near 100%.  The patient reports that dietary compliance has been fair.    A fib - denies falls.  no  bleeding  Allergies: 1)  ! Pcn  Past History:  Past Medical History: Hypertension History of diverticulosis Atrial Fibrillation  Anticoagulation therapy BPH Balanitis phimosis, preputial adhesions. CTS Hyperlipidemia H/O bradycardia on high doses of cardizem      Past Surgical History: Left Knee  Arhroscopy 07/2006 Decompressive laminectomy, L4-5 - 2004  Circumcision -2006  Carpal tunnel release 2007, 2008 Left knee arthroscopy, debridement, including partial medial and lateral meniscectomy 2008     Family History: Sister with breast cancer.  Mother with stroke.       Social History: Retired Chartered certified accountant Married with 3 children. No tobacco  No alcohol      Review of Systems       denies memory loss,  still balances checkbook  Physical Exam  General:  alert, well-developed, and well-nourished.   Head:  normocephalic and atraumatic.   Neck:  supple with no thyromegaly. No bruits noted. Lungs:  clear to auscultation Heart:   regular rate and rhythm Abdomen:  soft, non-tender, and normal bowel sounds.   Extremities:  no edema. Neurologic:  cranial nerves II-XII intact and gait normal.   Psych:  normally interactive, good eye contact, not anxious appearing, and not depressed appearing.     Impression &  Recommendations:  Problem # 1:  HYPERTENSION (ICD-401.9) stable.  Maintain current medication regimen.  His updated medication list for this problem includes:    Cardizem 30 Mg Tabs (Diltiazem hcl) .Marland Kitchen... 2 by mouth two times a day    Lisinopril 5 Mg Tabs (Lisinopril) ..... One by mouth qam  BP today: 130/76 Prior BP: 112/60 (03/05/2009)  Labs Reviewed: K+: 4.5 (08/19/2009) Creat: : 0.9 (08/19/2009)   Chol: 157 (08/19/2009)   HDL: 34.40 (08/19/2009)   LDL: 110 (08/19/2009)   TG: 62.0 (08/19/2009)  Problem # 2:  HYPERLIPIDEMIA (ICD-272.4) LDL is reasonable.  continue diet and exercise Labs Reviewed: SGOT: 30 (09/11/2008)   SGPT: 18 (09/11/2008)   HDL:34.40 (08/19/2009), 27.00 (03/06/2009)  LDL:110 (08/19/2009), 122 (03/06/2009)  Chol:157 (08/19/2009), 168 (03/06/2009)  Trig:62.0 (08/19/2009), 96.0 (03/06/2009)  Complete Medication List: 1)  Cardizem 30 Mg Tabs (Diltiazem hcl) .... 2 by mouth two times a day 2)  Fish Oil 1000 Mg Caps (Omega-3 fatty acids) .... By mouth once daily 3)  Proscar 5 Mg Tabs (Finasteride) .... One by mouth once daily 4)  Multivitamins Tabs (Multiple vitamin) .... One by mouth once daily 5)  Vitamin C 500 Mg Tabs (Ascorbic acid) .Marland Kitchen.. 1-2 by mouth once daily 6)  Ocuvite Tabs (Multiple vitamins-minerals) .... Take 1 tablet by mouth once a day 7)  Lisinopril 5 Mg Tabs (Lisinopril) .... One by mouth qam 8)  Vitamin D3 1000 Unit  Tabs (Cholecalciferol) .... Take 1 tablet by mouth once a day 9)  B Complex 100 Tabs (B complex vitamins) .... Take 1 tablet by mouth once a day 10)  Coumadin 5 Mg Tabs (Warfarin sodium) .... Take as directed by coumadin clinic. 11)  Zostavax [id number] Unt/0.68ml Solr (Zoster vaccine live) .... Administer vaccine x 1  Patient Instructions: 1)  Please schedule a follow-up appointment in 6 months. Prescriptions: ZOSTAVAX [id number] UNT/0.65ML SOLR (ZOSTER VACCINE LIVE) administer vaccine x 1  #1 x 0   Entered and Authorized by:   D. Thomos Lemons  DO   Signed by:   D. Thomos Lemons DO on 09/04/2009   Method used:   Print then Give to Patient   RxID:   [id number] LISINOPRIL 5 MG TABS (LISINOPRIL) one by mouth qam  #30 x 5   Entered and Authorized by:   D. Thomos Lemons DO   Signed by:   D. Thomos Lemons DO on 09/04/2009   Method used:   Print then Give to Patient   RxID:   [id number] PROSCAR 5 MG  TABS (FINASTERIDE) ONE by mouth once daily  #30 x 5   Entered and Authorized by:   D. Thomos Lemons DO   Signed by:   D. Thomos Lemons DO on 09/04/2009   Method used:   Print then Give to Patient   RxID:   [id number] CARDIZEM 30 MG  TABS (DILTIAZEM HCL) 2 by mouth two times a day  #120 x 5   Entered and Authorized by:   D. Thomos Lemons DO   Signed by:   D. Thomos Lemons DO on 09/04/2009   Method used:   Print then Give to Patient   RxID:   [id number]

## 2010-08-25 NOTE — Medication Information (Signed)
Summary: rov/ewj  Anticoagulant Therapy  Managed by: Eda Keys, PharmD Referring MD: Olga Millers MD PCP: Dondra Spry DO Supervising MD: Shirlee Latch MD, Dalton Indication 1: Atrial Fibrillation (ICD-427.31) Lab Used: [facility] Site: [facility] INR POC 2.2 INR RANGE 2 - 3  Dietary changes: no    Health status changes: no    Bleeding/hemorrhagic complications: no    Recent/future hospitalizations: no    Any changes in medication regimen? no    Recent/future dental: no  Any missed doses?: no       Is patient compliant with meds? yes       Allergies: 1)  ! Pcn  Anticoagulation Management History:      The patient is taking warfarin and comes in today for a routine follow up visit.  Positive risk factors for bleeding include an age of 75 years or older.  The bleeding index is 'intermediate risk'.  Positive CHADS2 values include History of HTN and Age > 38 years old.  The start date was 08/10/2000.  Anticoagulation responsible provider: Shirlee Latch MD, Dalton.  INR POC: 2.2.  Cuvette Lot#: [id number].  Exp: 02/2011.    Anticoagulation Management Assessment/Plan:      The patient's current anticoagulation dose is Coumadin 5 mg tabs: Take as directed by coumadin clinic..  The target INR is 2 - 3.  The next INR is due 01/06/2010.  Anticoagulation instructions were given to patient.  Results were reviewed/authorized by Eda Keys, PharmD.  He was notified by Eda Keys.         Prior Anticoagulation Instructions: INR 1.9  Take 2 tablets today, then resume same dosage 1.5 tablets daily.  Recheck in 4 weeks.    Current Anticoagulation Instructions: INR 2.2  Continue taking 1.5 tablets every day.   Return to clinic in 4 weeks.

## 2010-08-25 NOTE — Medication Information (Signed)
Summary: rov/sel   Anticoagulant Therapy  Managed by: Lyna Poser, PharmD Referring MD: Olga Millers MD PCP: Dondra Spry DO Supervising MD: Nahser Indication 1: Atrial Fibrillation (ICD-427.31) Lab Used: [facility] Site: [facility] INR POC 2.6 INR RANGE 2 - 3  Dietary changes: no    Health status changes: no    Bleeding/hemorrhagic complications: no    Recent/future hospitalizations: no    Any changes in medication regimen? no    Recent/future dental: no  Any missed doses?: no       Is patient compliant with meds? yes       Allergies: 1)  ! Pcn  Anticoagulation Management History:      The patient is taking warfarin and comes in today for a routine follow up visit.  Positive risk factors for bleeding include an age of 20 years or older.  The bleeding index is 'intermediate risk'.  Positive CHADS2 values include History of HTN and Age > 27 years old.  The start date was 08/10/2000.  Anticoagulation responsible Kearston Putman: Nahser.  INR POC: 2.6.  Cuvette Lot#: [id number].  Exp: 05/2011.    Anticoagulation Management Assessment/Plan:      The patient's current anticoagulation dose is Coumadin 5 mg tabs: Take as directed by coumadin clinic..  The target INR is 2 - 3.  The next INR is due 06/29/2010.  Anticoagulation instructions were given to patient.  Results were reviewed/authorized by Lyna Poser, PharmD.  He was notified by Lyna Poser PharmD.         Prior Anticoagulation Instructions: INR 1.9  Take 2 tablets today, then continue current dose of 1 1/2 tablets everyday. Recheck in 4 weeks.   Current Anticoagulation Instructions: INR 2.6 Continue taking 1.5 tablets everyday. Recheck in 4 weeks.

## 2010-08-25 NOTE — Medication Information (Signed)
Summary: rov/jm  Anticoagulant Therapy  Managed by: Weston Brass, PharmD Referring MD: Olga Millers MD PCP: Dondra Spry DO Supervising MD: Tenny Craw MD, Gunnar Fusi Indication 1: Atrial Fibrillation (ICD-427.31) Lab Used: [facility] Site: [facility] INR POC 1.9 INR RANGE 2 - 3  Dietary changes: no    Health status changes: no    Bleeding/hemorrhagic complications: no    Recent/future hospitalizations: no    Any changes in medication regimen? no    Recent/future dental: no  Any missed doses?: yes     Details: missed one dose 3 days ago  Is patient compliant with meds? yes       Allergies: 1)  ! Pcn  Anticoagulation Management History:      The patient is taking warfarin and comes in today for a routine follow up visit.  Positive risk factors for bleeding include an age of 75 years or older.  The bleeding index is 'intermediate risk'.  Positive CHADS2 values include History of HTN and Age > 60 years old.  The start date was 08/10/2000.  Anticoagulation responsible provider: Tenny Craw MD, Gunnar Fusi.  INR POC: 1.9.  Cuvette Lot#: [id number].  Exp: 05/2011.    Anticoagulation Management Assessment/Plan:      The patient's current anticoagulation dose is Coumadin 5 mg tabs: Take as directed by coumadin clinic..  The target INR is 2 - 3.  The next INR is due 06/01/2010.  Anticoagulation instructions were given to patient.  Results were reviewed/authorized by Weston Brass, PharmD.  He was notified by Ilean Skill D candidate.         Prior Anticoagulation Instructions: INR 2.4  Continue taking one and one-half tablets every day.  We will see you in four weeks.  Current Anticoagulation Instructions: INR 1.9  Take 2 tablets today, then continue current dose of 1 1/2 tablets everyday. Recheck in 4 weeks.

## 2010-08-25 NOTE — Letter (Signed)
Summary: Handicapped Plate/[facility]  Handicapped Plate/[facility]   Imported By: Lanelle Bal 10/17/2009 09:38:35  _____________________________________________________________________  External Attachment:    Type:   Image     Comment:   External Document

## 2010-08-25 NOTE — Medication Information (Signed)
Summary: [facility]v coumadin - [facility]  Anticoagulant Therapy  Managed by: Cloyde Reams, RN, BSN Referring MD: Olga Millers MD PCP: Dondra Spry DO Supervising MD: Eden Emms MD, Theron Arista Indication 1: Atrial Fibrillation (ICD-427.31) Lab Used: [facility] Site: [facility] INR POC 1.9 INR RANGE 2 - 3  Dietary changes: no    Health status changes: no    Bleeding/hemorrhagic complications: no    Recent/future hospitalizations: no    Any changes in medication regimen? no    Recent/future dental: no  Any missed doses?: no       Is patient compliant with meds? yes       Allergies (verified): 1)  ! Pcn  Anticoagulation Management History:      The patient is taking warfarin and comes in today for a routine follow up visit.  Positive risk factors for bleeding include an age of 30 years or older.  The bleeding index is 'intermediate risk'.  Positive CHADS2 values include History of HTN and Age > 39 years old.  The start date was 08/10/2000.  Anticoagulation responsible provider: Eden Emms MD, Theron Arista.  INR POC: 1.9.  Cuvette Lot#: [id number].  Exp: 11/2010.    Anticoagulation Management Assessment/Plan:      The patient's current anticoagulation dose is Coumadin 5 mg tabs: Take as directed by coumadin clinic..  The target INR is 2 - 3.  The next INR is due 12/09/2009.  Anticoagulation instructions were given to patient.  Results were reviewed/authorized by Cloyde Reams, RN, BSN.  He was notified by Cloyde Reams RN.         Prior Anticoagulation Instructions: INR 2.7  Coumadin 1 and 1/2 tab = 7.5mg  each day  Current Anticoagulation Instructions: INR 1.9  Take 2 tablets today, then resume same dosage 1.5 tablets daily.  Recheck in 4 weeks.

## 2010-08-25 NOTE — Assessment & Plan Note (Signed)
Summary: flu shot/mhf   Nurse Visit   Allergies: 1)  ! Pcn  Immunizations Administered:  Influenza Vaccine # 1:    Vaccine Type: Fluvax MCR    Site: left deltoid    Mfr: GlaxoSmithKline    Dose: 0.5 ml    Route: IM    Given by: Glendell Docker CMA    Exp. Date: 01/23/2011    Lot #: [id number]    VIS given: 02/17/10 version given April 30, 2010.  Flu Vaccine Consent Questions:    Do you have a history of severe allergic reactions to this vaccine? no    Any prior history of allergic reactions to egg and/or gelatin? no    Do you have a sensitivity to the preservative Thimersol? no    Do you have a past history of Guillan-Barre Syndrome? no    Do you currently have an acute febrile illness? no    Have you ever had a severe reaction to latex? no    Vaccine information given and explained to patient? yes  Orders Added: 1)  Influenza Vaccine MCR [00025] 2)  Flu Vaccine 57yrs + MEDICARE PATIENTS [[id number]]

## 2010-08-25 NOTE — Assessment & Plan Note (Signed)
Summary: f1y/ gd    Primary Provider:  Dondra Spry DO  CC:  yearly visit.  History of Present Illness: Geoffrey Romero is a very pleasant  gentleman who has a history of paroxysmal atrial fibrillation and preserved LV function.  A stress only Cardiolite performed in January of 2002 showed normal perfusion. The ejection fraction was 63%. I last saw him in July of 2010. We repeated an echocardiogram at that time that showed normal LV function, mild mitral regurgitation and mild right atrial enlargement. Since then he dy dyspnea, chest pain, syncope or pedal edema. There's been no bleeding. He occasionally feels a brief skipped but no sustained palpitations.  Current Medications (verified): 1)  Cardizem 30 Mg  Tabs (Diltiazem Hcl) .... 2 By Mouth Two Times A Day 2)  Fish Oil 1000 Mg  Caps (Omega-3 Fatty Acids) .... By Mouth Once Daily 3)  Proscar 5 Mg  Tabs (Finasteride) .... One By Mouth Once Daily 4)  Multivitamins   Tabs (Multiple Vitamin) .... One By Mouth Once Daily 5)  Vitamin C 500 Mg  Tabs (Ascorbic Acid) .Marland Kitchen.. 1-2 By Mouth Once Daily 6)  Ocuvite  Tabs (Multiple Vitamins-Minerals) .... Take 1 Tablet By Mouth Once A Day 7)  Lisinopril 5 Mg Tabs (Lisinopril) .... One By Mouth Qam 8)  Vitamin D3 1000 Unit Tabs (Cholecalciferol) .... Take 1 Tablet By Mouth Once A Day 9)  B Complex 100  Tabs (B Complex Vitamins) .... Take 1 Tablet By Mouth Once A Day 10)  Coumadin 5 Mg Tabs (Warfarin Sodium) .... Take As Directed By Coumadin Clinic.  Allergies: 1)  ! Pcn  Past History:  Past Medical History: Reviewed history from 09/04/2009 and no changes required. Hypertension History of diverticulosis Atrial Fibrillation  Anticoagulation therapy BPH Balanitis phimosis, preputial adhesions. CTS Hyperlipidemia H/O bradycardia on high doses of cardizem      Past Surgical History: Reviewed history from 09/04/2009 and no changes required. Left Knee  Arhroscopy 07/2006 Decompressive laminectomy, L4-5  - 2004  Circumcision -2006  Carpal tunnel release 2007, 2008 Left knee arthroscopy, debridement, including partial medial and lateral meniscectomy 2008     Social History: Reviewed history from 09/04/2009 and no changes required. 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 Remaining systems are negative.   Vital Signs:  Patient profile:   77 year old male Height:      69 inches Weight:      166 pounds BMI:     24.60 Pulse rate:   62 / minute Resp:     16 per minute BP sitting:   139 / 71  (left arm)  Vitals Entered By: Kem Parkinson (October 09, 2009 11:14 AM)  Physical Exam  General:  Well-developed well-nourished in no acute distress.  Skin is warm and dry.  HEENT is normal.  Neck is supple. No thyromegaly.  Chest is clear to auscultation with normal expansion.  Cardiovascular exam is regular rate and rhythm.  Abdominal exam nontender or distended. No masses palpated. Extremities show no edema. neuro grossly intact    EKG  Procedure date:  10/09/2009  Findings:      Sinus rhythm with occasional PVCs. No ST changes. Left axis deviation.  Impression & Recommendations:  Problem # 1:  COUMADIN THERAPY (ICD-V58.61)  Followed in the Coumadin clinic. Goal INR 2-3.  Problem # 2:  HYPERLIPIDEMIA (ICD-272.4) Management per primary care.  Problem # 3:  ATRIAL FIBRILLATION (ICD-427.31) Patient remains in sinus rhythm. Continue present medications. He has had bradycardia on higher doses of Cardizem. His updated medication list for this problem includes:    Coumadin 5 Mg Tabs (Warfarin sodium) .Marland Kitchen... Take as directed by coumadin clinic.  Problem # 4:  HYPERTENSION (ICD-401.9) Blood pressure controlled on present medications. Will  continue. His updated medication list for this problem includes:    Cardizem 30 Mg Tabs (Diltiazem hcl) .Marland Kitchen... 2 by mouth two times a day    Lisinopril 5 Mg Tabs (Lisinopril) ..... One by mouth qam  Patient Instructions: 1)  Your physician recommends that you schedule a follow-up appointment in: ONE YEAR

## 2010-08-25 NOTE — Medication Information (Signed)
Summary: rov/tm  Anticoagulant Therapy  Managed by: Weston Brass, PharmD Referring MD: Olga Millers MD PCP: Dondra Spry DO Supervising MD: Eden Emms MD, Theron Arista Indication 1: Atrial Fibrillation (ICD-427.31) Lab Used: [facility] Site: [facility] INR POC 2.3 INR RANGE 2 - 3  Dietary changes: no    Health status changes: no    Bleeding/hemorrhagic complications: no    Recent/future hospitalizations: no    Any changes in medication regimen? no    Recent/future dental: no  Any missed doses?: no       Is patient compliant with meds? yes       Allergies: 1)  ! Pcn  Anticoagulation Management History:      The patient is taking warfarin and comes in today for a routine follow up visit.  Positive risk factors for bleeding include an age of 50 years or older.  The bleeding index is 'intermediate risk'.  Positive CHADS2 values include History of HTN and Age > 85 years old.  The start date was 08/10/2000.  Anticoagulation responsible provider: Eden Emms MD, Theron Arista.  INR POC: 2.3.  Cuvette Lot#: [id number].  Exp: 03/2011.    Anticoagulation Management Assessment/Plan:      The patient's current anticoagulation dose is Coumadin 5 mg tabs: Take as directed by coumadin clinic..  The target INR is 2 - 3.  The next INR is due 03/09/2010.  Anticoagulation instructions were given to patient.  Results were reviewed/authorized by Weston Brass, PharmD.  He was notified by Weston Brass PharmD.         Prior Anticoagulation Instructions: INR 2.1 Continue 7.5mg s daily. Recheck in 4 weeks.   Current Anticoagulation Instructions: INR 2.3  Continue same dose of 1 1/2 tablets every day.

## 2010-08-25 NOTE — Medication Information (Signed)
Summary: rov/ewj  Anticoagulant Therapy  Managed by: Shelby Dubin, PharmD, BCPS, CPP Referring MD: Olga Millers MD PCP: Dondra Spry DO Supervising MD: Jens Som MD, Arlys John Indication 1: Atrial Fibrillation (ICD-427.31) Lab Used: [facility] Site: [facility] INR POC 1.4 INR RANGE 2 - 3  Dietary changes: yes       Details: incr greens  Health status changes: no    Bleeding/hemorrhagic complications: yes       Details: at sinuses only  Recent/future hospitalizations: no    Any changes in medication regimen? no    Recent/future dental: no  Any missed doses?: yes     Details: possibly associated with son's broken hip and subsequent hospitalization--son to have surgery this afternoon  Is patient compliant with meds? yes      Comments: Precautions given.   Current Medications (verified): 1)  Cardizem 30 Mg  Tabs (Diltiazem Hcl) .... 2 By Mouth Two Times A Day 2)  Fish Oil 1000 Mg  Caps (Omega-3 Fatty Acids) .... By Mouth Once Daily 3)  Proscar 5 Mg  Tabs (Finasteride) .... One By Mouth Once Daily 4)  Multivitamins   Tabs (Multiple Vitamin) .... One By Mouth Once Daily 5)  Vitamin C 500 Mg  Tabs (Ascorbic Acid) .Marland Kitchen.. 1-2 By Mouth Once Daily 6)  Ocuvite  Tabs (Multiple Vitamins-Minerals) .... Take 1 Tablet By Mouth Once A Day 7)  Lisinopril 5 Mg Tabs (Lisinopril) .... One By Mouth Qam 8)  Vitamin D3 1000 Unit Tabs (Cholecalciferol) .... Take 1 Tablet By Mouth Once A Day 9)  B Complex 100  Tabs (B Complex Vitamins) .... Take 1 Tablet By Mouth Once A Day 10)  Coumadin 5 Mg Tabs (Warfarin Sodium) .... Take As Directed By Coumadin Clinic.  Allergies (verified): 1)  ! Pcn  Anticoagulation Management History:      His anticoagulation is being managed by telephone today.  Positive risk factors for bleeding include an age of 75 years or older.  The bleeding index is 'intermediate risk'.  Positive CHADS2 values include History of HTN and Age > 57 years old.  The start date was  08/10/2000.  Anticoagulation responsible provider: Jens Som MD, Arlys John.  INR POC: 1.4.  Cuvette Lot#: [id number].  Exp: 08/2010.    Anticoagulation Management Assessment/Plan:      The patient's current anticoagulation dose is Coumadin 5 mg tabs: Take as directed by coumadin clinic..  The target INR is 2 - 3.  The next INR is due 08/26/2009.  Anticoagulation instructions were given to patient.  Results were reviewed/authorized by Shelby Dubin, PharmD, BCPS, CPP.  He was notified by Shelby Dubin PharmD, BCPS, CPP.         Prior Anticoagulation Instructions: INR 1.8  Take 2 tablets today, then resume same dosage 1.5 tablets daily except 1 tablet on Mondays.  Recheck in 3-4 weeks.    Current Anticoagulation Instructions: INR 1.4  Take 2 tabs today then take 1.5 tabs every day.    Recheck in 4 weeks per patient request.

## 2010-08-25 NOTE — Medication Information (Signed)
Summary: rov/eac  Anticoagulant Therapy  Managed by: Leota Sauers, PharmD, BCPS, CPP Referring MD: Olga Millers MD PCP: Dondra Spry DO Supervising MD: Ladona Ridgel MD, Sharlot Gowda Indication 1: Atrial Fibrillation (ICD-427.31) Lab Used: [facility] Site: [facility] INR POC 2.7 INR RANGE 2 - 3  Dietary changes: no    Health status changes: no    Bleeding/hemorrhagic complications: no    Recent/future hospitalizations: no    Any changes in medication regimen? no    Recent/future dental: no  Any missed doses?: no       Is patient compliant with meds? yes       Current Medications (verified): 1)  Cardizem 30 Mg  Tabs (Diltiazem Hcl) .... 2 By Mouth Two Times A Day 2)  Fish Oil 1000 Mg  Caps (Omega-3 Fatty Acids) .... By Mouth Once Daily 3)  Proscar 5 Mg  Tabs (Finasteride) .... One By Mouth Once Daily 4)  Multivitamins   Tabs (Multiple Vitamin) .... One By Mouth Once Daily 5)  Vitamin C 500 Mg  Tabs (Ascorbic Acid) .Marland Kitchen.. 1-2 By Mouth Once Daily 6)  Ocuvite  Tabs (Multiple Vitamins-Minerals) .... Take 1 Tablet By Mouth Once A Day 7)  Lisinopril 5 Mg Tabs (Lisinopril) .... One By Mouth Qam 8)  Vitamin D3 1000 Unit Tabs (Cholecalciferol) .... Take 1 Tablet By Mouth Once A Day 9)  B Complex 100  Tabs (B Complex Vitamins) .... Take 1 Tablet By Mouth Once A Day 10)  Coumadin 5 Mg Tabs (Warfarin Sodium) .... Take As Directed By Coumadin Clinic. 11)  Zostavax 09811 Unt/0.57ml Solr (Zoster Vaccine Live) .... Administer Vaccine X 1  Allergies: 1)  ! Pcn  Anticoagulation Management History:      The patient is taking warfarin and comes in today for a routine follow up visit.  Positive risk factors for bleeding include an age of 75 years or older.  The bleeding index is 'intermediate risk'.  Positive CHADS2 values include History of HTN and Age > 75 years old.  The start date was 08/10/2000.  Anticoagulation responsible provider: Ladona Ridgel MD, Sharlot Gowda.  INR POC: 2.7.  Exp: 10/2010.     Anticoagulation Management Assessment/Plan:      The patient's current anticoagulation dose is Coumadin 5 mg tabs: Take as directed by coumadin clinic..  The target INR is 2 - 3.  The next INR is due 11/06/2009.  Anticoagulation instructions were given to patient.  Results were reviewed/authorized by Leota Sauers, PharmD, BCPS, CPP.         Prior Anticoagulation Instructions: INR 2.3  Continue current dosing schedule.  Take 1.5 tablets (7.5 mg) every day.  Return to clinic in 4 weeks.  Current Anticoagulation Instructions: INR 2.7  Coumadin 1 and 1/2 tab = 7.5mg  each day

## 2010-08-25 NOTE — Miscellaneous (Signed)
Summary: Zostavax/[facility]  Zostavax/[facility]   Imported By: Lanelle Bal 01/27/2010 10:37:03  _____________________________________________________________________  External Attachment:    Type:   Image     Comment:   External Document

## 2010-08-25 NOTE — Miscellaneous (Signed)
Summary: zostavax  Clinical Lists Changes  Observations: Added new observation of ZOSTAVAX: Zostavax (12/26/2009 11:56)      Immunization History:  Zostavax History:    Zostavax # 1:  zostavax (12/26/2009)

## 2010-08-26 ENCOUNTER — Ambulatory Visit: Admit: 2010-08-26 | Payer: Self-pay

## 2010-08-26 ENCOUNTER — Encounter (INDEPENDENT_AMBULATORY_CARE_PROVIDER_SITE_OTHER): Payer: MEDICARE

## 2010-08-26 ENCOUNTER — Encounter: Payer: Self-pay | Admitting: Cardiovascular Disease

## 2010-08-26 DIAGNOSIS — I4891 Unspecified atrial fibrillation: Secondary | ICD-10-CM

## 2010-08-26 DIAGNOSIS — Z7901 Long term (current) use of anticoagulants: Secondary | ICD-10-CM

## 2010-08-27 NOTE — Medication Information (Signed)
Summary: CCR/JML   Anticoagulant Therapy  Managed by: Weston Brass, PharmD Referring MD: Olga Millers MD PCP: Dondra Spry DO Supervising MD: Patty Sermons Indication 1: Atrial Fibrillation (ICD-427.31) Lab Used: [facility] Site: [facility] INR POC 2.5 INR RANGE 2 - 3  Dietary changes: no    Health status changes: no    Bleeding/hemorrhagic complications: no    Recent/future hospitalizations: no    Any changes in medication regimen? no    Recent/future dental: no  Any missed doses?: yes     Details: may have missed some doses but unsure  Is patient compliant with meds? yes       Allergies: 1)  ! Pcn  Anticoagulation Management History:      The patient is taking warfarin and comes in today for a routine follow up visit.  Positive risk factors for bleeding include an age of 75 years or older.  The bleeding index is 'intermediate risk'.  Positive CHADS2 values include History of HTN and Age > 60 years old.  The start date was 08/10/2000.  Anticoagulation responsible provider: Andrew Soria.  INR POC: 2.5.  Cuvette Lot#: [id number].  Exp: 08/2011.    Anticoagulation Management Assessment/Plan:      The patient's current anticoagulation dose is Coumadin 5 mg tabs: Take as directed by coumadin clinic..  The target INR is 2 - 3.  The next INR is due 08/26/2010.  Anticoagulation instructions were given to patient.  Results were reviewed/authorized by Weston Brass, PharmD.  He was notified by Weston Brass PharmD.         Prior Anticoagulation Instructions: INR 2.6 Continue taking 1.5 tablets everyday. Recheck in 4 weeks.  Current Anticoagulation Instructions: INR 2.5  Continue same dose of 1 1/2 tablets every day.  Recheck INR in 4 weeks.

## 2010-09-02 NOTE — Medication Information (Signed)
Summary: Geoffrey Romero   Anticoagulant Therapy  Managed by: Tammy Sours, PharmD Referring MD: Olga Millers MD PCP: Dondra Spry DO Supervising MD: Eden Emms MD, Theron Arista Indication 1: Atrial Fibrillation (ICD-427.31) Lab Used: [facility] Site: [facility] INR POC 2.8 INR RANGE 2 - 3  Dietary changes: no    Health status changes: no    Bleeding/hemorrhagic complications: yes       Details: a little bit of blood when blowing nose   Recent/future hospitalizations: no    Any changes in medication regimen? no    Recent/future dental: no  Any missed doses?: no       Is patient compliant with meds? yes      Comments: Pt requested to come back in 4 weeks.  Refused to come back until 6 weeks.  Pt aware of risks.   Allergies: 1)  ! Pcn  Anticoagulation Management History:      The patient is taking warfarin and comes in today for a routine follow up visit.  Positive risk factors for bleeding include an age of 75 years or older.  The bleeding index is 'intermediate risk'.  Positive CHADS2 values include History of HTN and Age > 28 years old.  The start date was 08/10/2000.  Anticoagulation responsible provider: Eden Emms MD, Theron Arista.  INR POC: 2.8.  Cuvette Lot#: [id number].  Exp: 08/2011.    Anticoagulation Management Assessment/Plan:      The patient's current anticoagulation dose is Coumadin 5 mg tabs: Take as directed by coumadin clinic..  The target INR is 2 - 3.  The next INR is due 10/05/2010.  Anticoagulation instructions were given to patient.  Results were reviewed/authorized by Tammy Sours, PharmD.         Prior Anticoagulation Instructions: INR 2.5  Continue same dose of 1 1/2 tablets every day.  Recheck INR in 4 weeks.   Current Anticoagulation Instructions: INR 2.8  Continue taking the same dose of 1 and 1/2 tablets every day. Recheck INR in 6 weeks. Patient refused coming back in 4 weeks.

## 2010-09-30 ENCOUNTER — Encounter: Payer: Self-pay | Admitting: Internal Medicine

## 2010-09-30 ENCOUNTER — Encounter (INDEPENDENT_AMBULATORY_CARE_PROVIDER_SITE_OTHER): Payer: Medicare Other | Admitting: Internal Medicine

## 2010-09-30 DIAGNOSIS — N401 Enlarged prostate with lower urinary tract symptoms: Secondary | ICD-10-CM

## 2010-09-30 DIAGNOSIS — I10 Essential (primary) hypertension: Secondary | ICD-10-CM

## 2010-09-30 DIAGNOSIS — E785 Hyperlipidemia, unspecified: Secondary | ICD-10-CM

## 2010-09-30 LAB — CONVERTED CEMR LAB
ALT: 18 units/L (ref 0–53)
AST: 23 units/L (ref 0–37)
Bilirubin, Direct: 0.1 mg/dL (ref 0.0–0.3)
Calcium: 9.4 mg/dL (ref 8.4–10.5)
Cholesterol: 169 mg/dL (ref 0–200)
Glucose, Bld: 90 mg/dL (ref 70–99)
Potassium: 4.8 meq/L (ref 3.5–5.3)
Sodium: 143 meq/L (ref 135–145)
Total CHOL/HDL Ratio: 4.4
Total Protein: 7 g/dL (ref 6.0–8.3)
Triglycerides: 66 mg/dL (ref <150)
VLDL: 13 mg/dL (ref 0–40)

## 2010-10-01 ENCOUNTER — Encounter: Payer: Self-pay | Admitting: Internal Medicine

## 2010-10-05 ENCOUNTER — Ambulatory Visit (INDEPENDENT_AMBULATORY_CARE_PROVIDER_SITE_OTHER): Payer: Medicare Other | Admitting: Cardiology

## 2010-10-05 ENCOUNTER — Encounter (INDEPENDENT_AMBULATORY_CARE_PROVIDER_SITE_OTHER): Payer: Medicare Other

## 2010-10-05 ENCOUNTER — Other Ambulatory Visit: Payer: Self-pay | Admitting: Cardiology

## 2010-10-05 ENCOUNTER — Encounter: Payer: Self-pay | Admitting: Cardiology

## 2010-10-05 DIAGNOSIS — Z7901 Long term (current) use of anticoagulants: Secondary | ICD-10-CM

## 2010-10-05 DIAGNOSIS — I4891 Unspecified atrial fibrillation: Secondary | ICD-10-CM

## 2010-10-05 LAB — CBC WITH DIFFERENTIAL/PLATELET
Basophils Absolute: 0 10*3/uL (ref 0.0–0.1)
Eosinophils Absolute: 0.2 10*3/uL (ref 0.0–0.7)
HCT: 40.9 % (ref 39.0–52.0)
Lymphs Abs: 1.3 10*3/uL (ref 0.7–4.0)
MCHC: 34.1 g/dL (ref 30.0–36.0)
MCV: 95.1 fl (ref 78.0–100.0)
Monocytes Absolute: 0.5 10*3/uL (ref 0.1–1.0)
Neutrophils Relative %: 64.3 % (ref 43.0–77.0)
Platelets: 166 10*3/uL (ref 150.0–400.0)
RDW: 13.9 % (ref 11.5–14.6)
WBC: 5.7 10*3/uL (ref 4.5–10.5)

## 2010-10-05 LAB — CONVERTED CEMR LAB: POC INR: 2.5

## 2010-10-06 NOTE — Letter (Signed)
   [facility] [address] [place], [place]  [address]  [place] Phone: [phone]      October 01, 2010   AKAI DOLLARD [address] [place], [place] [address]  RE:  LAB RESULTS  Dear  Mr. Cwikla,  The following is an interpretation of your most recent lab tests.  Please take note of any instructions provided or changes to medications that have resulted from your lab work.  PSA:  normal - no follow-up needed PSA: 2.68  ELECTROLYTES:  Good - no changes needed  KIDNEY FUNCTION TESTS:  Good - no changes needed  LIPID PANEL:  Stable - no changes needed Triglyceride: 66   Cholesterol: 169   LDL: 118   HDL: 38   Chol/HDL%:  4.4 Ratio  THYROID STUDIES:  Thyroid studies normal TSH: 3.474          Sincerely Yours,    Dr. Thomos Lemons  Appended Document:  mailed

## 2010-10-13 NOTE — Assessment & Plan Note (Signed)
Summary: [email or web address] has coumadin appt. at 8:15/sl  Medications Added VITAMIN C 500 MG  TABS (ASCORBIC ACID) 1  by mouth once daily        Visit Type:  Follow-up Primary Provider:  Dondra Spry DO   History of Present Illness: [name] is a very pleasant  gentleman who has a history of paroxysmal atrial fibrillation and preserved LV function.  A stress only Cardiolite performed in January of 2002 showed normal perfusion. The ejection fraction was 63%.  We repeated an echocardiogram in July of 2010 that showed normal LV function, mild mitral regurgitation and mild right atrial enlargement. I last saw him in March of 2011. Since then he denies dyspnea, chest pain, syncope or bleeding. He had one episode of sustained palpitations approximately 5 weeks ago. There were no associated symptoms. He took an extra Cardizem and went to bed. His symptoms have resolved after awakening.  Current Medications (verified): 1)  Cardizem 30 Mg  Tabs (Diltiazem Hcl) .... 2 By Mouth Two Times A Day 2)  Fish Oil 1000 Mg  Caps (Omega-3 Fatty Acids) .... By Mouth Once Daily 3)  Proscar 5 Mg  Tabs (Finasteride) .... One By Mouth Once Daily 4)  Multivitamins   Tabs (Multiple Vitamin) .... One By Mouth Once Daily 5)  Vitamin C 500 Mg  Tabs (Ascorbic Acid) .Marland Kitchen.. 1-2 By Mouth Once Daily 6)  Ocuvite  Tabs (Multiple Vitamins-Minerals) .... Take 1 Tablet By Mouth Once A Day 7)  Lisinopril 5 Mg Tabs (Lisinopril) .... 1/2 Tablet By Mouth Once Daily 8)  Vitamin D3 1000 Unit Tabs (Cholecalciferol) .... Take 1 Tablet By Mouth Once A Day 9)  B Complex 100  Tabs (B Complex Vitamins) .... Take 1 Tablet By Mouth Once A Day 10)  Coumadin 5 Mg Tabs (Warfarin Sodium) .... Take As Directed By Coumadin Clinic.  Allergies: 1)  ! Pcn  Past History:  Past Medical History: Reviewed history from 09/04/2009 and no changes required. Hypertension History of diverticulosis Atrial Fibrillation  Anticoagulation  therapy BPH Balanitis phimosis, preputial adhesions. CTS Hyperlipidemia H/O bradycardia on high doses of cardizem      Past Surgical History: Reviewed history from 09/04/2009 and no changes required. Left Knee  Arhroscopy 07/2006 Decompressive laminectomy, L4-5 - 2004  Circumcision -2006  Carpal tunnel release 2007, 2008 Left knee arthroscopy, debridement, including partial medial and lateral meniscectomy 2008     Social History: Reviewed history from 09/04/2009 and no changes required. 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 Remaining systems are negative.   Vital Signs:  Patient profile:   77 year old male Height:      69 inches Weight:      168 pounds BMI:     24.90 Pulse rate:   59 / minute BP sitting:   124 / 50  (left arm)  Vitals Entered By: Laurance Flatten CMA (October 05, 2010 8:23 AM)  Physical Exam  General:  Well-developed well-nourished in no acute distress.  Skin is warm and dry.  HEENT is normal.  Neck is supple. No thyromegaly.  Chest is clear to auscultation with normal expansion.  Cardiovascular exam is regular rate and rhythm.  Abdominal exam nontender or distended. No masses palpated. Extremities show no edema. neuro grossly intact  EKG  Procedure date:  10/05/2010  Findings:      Sinus rhythm at a rate of 59.nonspecific ST changes. Left axis deviation.  Impression & Recommendations:  Problem # 1:  COUMADIN THERAPY (ICD-V58.61) Goal INR 2-3. Check CBC. Orders: TLB-CBC Platelet - w/Differential ([id number]-[id number])  Problem # 2:  HYPERLIPIDEMIA (ICD-272.4) Management per primary care.  Problem # 3:  ATRIAL FIBRILLATION (ICD-427.31) Patient sounds to have had an episode of atrial fibrillation 5 weeks ago. It  resolved after taking an additional Cardizem. Continue present medications. If his symptoms worsen in the future or become more frequent I will consider a monitor to document recurrent atrial fibrillation and then possibly an antiarrhythmic. His updated medication list for this problem includes:    Coumadin 5 Mg Tabs (Warfarin sodium) .Marland Kitchen... Take as directed by coumadin clinic.  Orders: TLB-CBC Platelet - w/Differential ([id number])  Problem # 4:  HYPERTENSION (ICD-401.9) Blood pressure controlled. Continue present medications. His updated medication list for this problem includes:    Cardizem 30 Mg Tabs (Diltiazem hcl) .Marland Kitchen... 2 by mouth two times a day    Lisinopril 5 Mg Tabs (Lisinopril) .Marland Kitchen... 1/2 tablet by mouth once daily  Patient Instructions: 1)  Your physician wants you to follow-up in:ONE YEAR   You will receive a reminder letter in the mail two months in advance. If you don't receive a letter, please call our office to schedule the follow-up appointment.

## 2010-10-13 NOTE — Medication Information (Signed)
Summary: rov/sp appt. with crenshaw at 8:30/sl  Anticoagulant Therapy  Managed by: Cloyde Reams, RN, BSN Referring MD: Olga Millers MD PCP: Dondra Spry DO Supervising MD: Jens Som MD, Arlys John Indication 1: Atrial Fibrillation (ICD-427.31) Lab Used: [facility]  Site: [facility] INR POC 2.5 INR RANGE 2 - 3  Dietary changes: no    Health status changes: no    Bleeding/hemorrhagic complications: no    Recent/future hospitalizations: no    Any changes in medication regimen? no    Recent/future dental: no  Any missed doses?: no       Is patient compliant with meds? yes       Allergies: 1)  ! Pcn  Anticoagulation Management History:      The patient is taking warfarin and comes in today for a routine follow up visit.  Positive risk factors for bleeding include an age of 75 years or older.  The bleeding index is 'intermediate risk'.  Positive CHADS2 values include History of HTN and Age > 51 years old.  The start date was 08/10/2000.  Anticoagulation responsible provider: Jens Som MD, Arlys John.  INR POC: 2.5.  Cuvette Lot#: [id number].  Exp: 09/2011.    Anticoagulation Management Assessment/Plan:      The patient's current anticoagulation dose is Coumadin 5 mg tabs: Take as directed by coumadin clinic..  The target INR is 2 - 3.  The next INR is due 11/16/2010.  Anticoagulation instructions were given to patient.  Results were reviewed/authorized by Cloyde Reams, RN, BSN.  He was notified by Cloyde Reams RN.         Prior Anticoagulation Instructions: INR 2.8  Continue taking the same dose of 1 and 1/2 tablets every day. Recheck INR in 6 weeks. Patient refused coming back in 4 weeks.    Current Anticoagulation Instructions: INR 2.5  Continue on same dosage 1.5 tablets daily.  Recheck in 6 weeks.

## 2010-10-22 NOTE — Assessment & Plan Note (Signed)
Summary: patient will come fasting follow up/mhf   Vital Signs:  Patient profile:   77 year old male Height:      69 inches Weight:      167 pounds BMI:     24.75 O2 Sat:      99 % on Room air Temp:     97.4 degrees F oral Pulse rate:   78 / minute Resp:     18 per minute BP sitting:   108 / 60  (left arm) Cuff size:   large  Vitals Entered By: Glendell Docker CMA (September 30, 2010 8:44 AM)  O2 Flow:  Room air CC: follow-up visit Is Patient Diabetic? No Pain Assessment Patient in pain? no      Comments fasting for labs   Primary Care Provider:  D. Thomos Lemons DO  CC:  follow-up visit.  History of Present Illness:  Hypertension Follow-Up      This is an 77 year old man who presents for Hypertension follow-up.  The patient denies lightheadedness and headaches.  The patient denies the following associated symptoms: chest pain.  Compliance with medications (by patient report) has been near 100%.  The patient reports that dietary compliance has been fair.    Afib - denies abnormal bleeding  Preventive Screening-Counseling & Management  Alcohol-Tobacco     Smoking Status: quit  Allergies: 1)  ! Pcn  Past History:  Past Medical History: Hypertension History of diverticulosis  Atrial Fibrillation  Anticoagulation therapy BPH Balanitis phimosis, preputial adhesions. CTS Hyperlipidemia H/O bradycardia on high doses of cardizem      Past Surgical History: Left Knee  Arhroscopy 07/2006 Decompressive laminectomy, L4-5 - 2004   Circumcision -2006  Carpal tunnel release 2007, 2008 Left knee arthroscopy, debridement, including partial medial and lateral meniscectomy 2008     Family History: Sister with breast cancer.  Mother with stroke.        Social History: Retired Chartered certified accountant Married with 3 children. No tobacco  No alcohol       Review of Systems  The patient denies fever, weight gain, chest pain, syncope, prolonged cough, abdominal pain, melena,  hematochezia, and severe indigestion/heartburn.         denies falls,  denies memory loss  Physical Exam  General:  alert, well-developed, and well-nourished.   Head:  normocephalic and atraumatic.   Mouth:  pharynx pink and moist.   Neck:  supple with no thyromegaly. No bruits noted. Lungs:  normal respiratory effort, normal breath sounds, no crackles, and no wheezes.   Heart:   regular rate and rhythmno gallop.   Abdomen:  soft, non-tender, normal bowel sounds, no masses, no hepatomegaly, and no splenomegaly.   Extremities:  no edema. Neurologic:  cranial nerves II-XII intact and gait normal.   Psych:  normally interactive, good eye contact, not anxious appearing, and not depressed appearing.     Impression & Recommendations:  Problem # 1:  HYPERTENSION (ICD-401.9) Assessment Improved  His updated medication list for this problem includes:    Cardizem 30 Mg Tabs (Diltiazem hcl) .Marland Kitchen... 2 by mouth two times a day    Lisinopril 5 Mg Tabs (Lisinopril) .Marland Kitchen... 1/2 tablet by mouth once daily  Orders: T-Basic Metabolic Panel [phone])  BP today: 108/60 Prior BP: 139/71 (10/09/2009)  Labs Reviewed: K+: 4.5 (08/19/2009) Creat: : 0.9 (08/19/2009)   Chol: 157 (08/19/2009)   HDL: 34.40 (08/19/2009)   LDL: 110 (08/19/2009)   TG: 62.0 (08/19/2009)  Problem # 2:  BENIGN  PROSTATIC HYPERTROPHY, WITH OBSTRUCTION (ICD-600.01) Assessment: Unchanged  His updated medication list for this problem includes:    Proscar 5 Mg Tabs (Finasteride) ..... One by mouth once daily  Orders: T-PSA (45409-81191)  Problem # 3:  HYPERLIPIDEMIA (ICD-272.4) continue low saturated fat diet  Orders: T-Lipid Profile (47829-56213) T-Hepatic Function [phone]) T-TSH ([phone])  Labs Reviewed: SGOT: 30 (09/11/2008)   SGPT: 18 (09/11/2008)   HDL:34.40 (08/19/2009), 27.00 (03/06/2009)  LDL:110 (08/19/2009), 122 (03/06/2009)  Chol:157 (08/19/2009), 168 (03/06/2009)  Trig:62.0 (08/19/2009), 96.0  (03/06/2009)  Complete Medication List: 1)  Cardizem 30 Mg Tabs (Diltiazem hcl) .... 2 by mouth two times a day 2)  Fish Oil 1000 Mg Caps (Omega-3 fatty acids) .... By mouth once daily 3)  Proscar 5 Mg Tabs (Finasteride) .... One by mouth once daily 4)  Multivitamins Tabs (Multiple vitamin) .... One by mouth once daily 5)  Vitamin C 500 Mg Tabs (Ascorbic acid) .Marland Kitchen.. 1  by mouth once daily 6)  Ocuvite Tabs (Multiple vitamins-minerals) .... Take 1 tablet by mouth once a day 7)  Lisinopril 5 Mg Tabs (Lisinopril) .... 1/2 tablet by mouth once daily 8)  Vitamin D3 1000 Unit Tabs (Cholecalciferol) .... Take 1 tablet by mouth once a day 9)  B Complex 100 Tabs (B complex vitamins) .... Take 1 tablet by mouth once a day 10)  Coumadin 5 Mg Tabs (Warfarin sodium) .... Take as directed by coumadin clinic.  Patient Instructions: 1)  Please schedule a follow-up appointment in 6 months. Prescriptions: LISINOPRIL 5 MG TABS (LISINOPRIL) 1/2 tablet by mouth once daily  #45 x 1   Entered and Authorized by:   D. Thomos Lemons DO   Signed by:   D. Thomos Lemons DO on 09/30/2010   Method used:   Print then Give to Patient   RxID:   [id number] PROSCAR 5 MG  TABS (FINASTERIDE) ONE by mouth once daily  #90 x 1   Entered and Authorized by:   D. Thomos Lemons DO   Signed by:   D. Thomos Lemons DO on 09/30/2010   Method used:   Print then Give to Patient   RxID:   [id number] CARDIZEM 30 MG  TABS (DILTIAZEM HCL) 2 by mouth two times a day  #360 x 1   Entered and Authorized by:   D. Thomos Lemons DO   Signed by:   D. Thomos Lemons DO on 09/30/2010   Method used:   Print then Give to Patient   RxID:   [id number]    Orders Added: 1)  T-Basic Metabolic Panel [[id number]] 2)  T-PSA [10932-35573] 3)  T-Lipid Profile [22025-42706] 4)  T-Hepatic Function [80076-22960] 5)  T-TSH [23762-83151] 6)  Est. Patient Level III [76160]    Current Allergies (reviewed today): ! PCN

## 2010-10-29 ENCOUNTER — Ambulatory Visit (INDEPENDENT_AMBULATORY_CARE_PROVIDER_SITE_OTHER): Payer: Medicare Other | Admitting: Internal Medicine

## 2010-10-29 ENCOUNTER — Ambulatory Visit (HOSPITAL_BASED_OUTPATIENT_CLINIC_OR_DEPARTMENT_OTHER)
Admission: RE | Admit: 2010-10-29 | Discharge: 2010-10-29 | Disposition: A | Discharge status: Home / Self Care | Payer: Medicare Other | Source: Ambulatory Visit | Attending: Internal Medicine | Admitting: Internal Medicine

## 2010-10-29 ENCOUNTER — Encounter: Payer: Self-pay | Admitting: Internal Medicine

## 2010-10-29 ENCOUNTER — Other Ambulatory Visit: Payer: Self-pay | Admitting: Internal Medicine

## 2010-10-29 ENCOUNTER — Ambulatory Visit (INDEPENDENT_AMBULATORY_CARE_PROVIDER_SITE_OTHER)
Admission: RE | Admit: 2010-10-29 | Discharge: 2010-10-29 | Disposition: A | Discharge status: Home / Self Care | Payer: Medicare Other | Source: Ambulatory Visit | Attending: Internal Medicine | Admitting: Internal Medicine

## 2010-10-29 VITALS — BP 120/80 | HR 64 | Temp 97.6°F | Resp 16 | Ht 69.0 in | Wt 167.0 lb

## 2010-10-29 DIAGNOSIS — M7730 Calcaneal spur, unspecified foot: Secondary | ICD-10-CM | POA: Insufficient documentation

## 2010-10-29 DIAGNOSIS — M79672 Pain in left foot: Secondary | ICD-10-CM

## 2010-10-29 DIAGNOSIS — M79609 Pain in unspecified limb: Secondary | ICD-10-CM | POA: Insufficient documentation

## 2010-10-29 DIAGNOSIS — M949 Disorder of cartilage, unspecified: Secondary | ICD-10-CM | POA: Insufficient documentation

## 2010-10-29 DIAGNOSIS — M2140 Flat foot [pes planus] (acquired), unspecified foot: Secondary | ICD-10-CM | POA: Insufficient documentation

## 2010-10-29 DIAGNOSIS — M899 Disorder of bone, unspecified: Secondary | ICD-10-CM | POA: Insufficient documentation

## 2010-10-29 MED ORDER — DICLOFENAC SODIUM 1 % TD GEL: TRANSDERMAL | Status: DC

## 2010-10-29 NOTE — Patient Instructions (Signed)
Please call our office if your symptoms do not improve or gets worse. Use left shoe insert as directed

## 2010-10-31 DIAGNOSIS — M79672 Pain in left foot: Secondary | ICD-10-CM | POA: Insufficient documentation

## 2010-10-31 NOTE — Progress Notes (Signed)
  Subjective:    Patient ID: Geoffrey Romero, male    DOB: 1924-01-15, 77 y.o.   MRN: [id number]  Foot Pain This is a new problem. The current episode started in the past 7 days. The problem occurs daily. The problem has been unchanged. The symptoms are aggravated by standing and walking. He has tried nothing for the symptoms.   Pt noticed symptoms after bowling.   He bowls on regular basis.  Pain localized to lateral part of left foot.  He denies medial heel pain.  Some discomfort posterior heel.    Review of Systems no redness or edema    Past Medical History  Diagnosis Date  . Hypertension   . Hyperlipidemia   . History of diverticulitis of colon     history of diverticulosis  . AF (atrial fibrillation)   . Long term current use of anticoagulant     anticoagulant therapy  . BPH (benign prostatic hyperplasia)   . Balanitis     phimosis, preputial adhesions  . CTS (carpal tunnel syndrome)   . History of sinus bradycardia     history of bradycardia on hig doses of Cardizem    History   Social History  . Marital Status: Married    Spouse Name: N/A    Number of Children: N/A  . Years of Education: N/A   Occupational History  . Not on file.   Social History Main Topics  . Smoking status: Former Games developer  . Smokeless tobacco: Not on file  . Alcohol Use: Not on file  . Drug Use: Not on file  . Sexually Active: Not on file   Other Topics Concern  . 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 Knee arthroscopy 07/2006    left knee debridement, including partial medial and lateral menisectomy  . Lumbar laminectomy/decompression microdiscectomy 2004    L4-5  . Circumcision 2006  . Carpal tunnel release 2007/2008    Family History  Problem Relation Age of Onset  . Breast cancer Sister   . Stroke Mother     Allergies  Allergen Reactions  . Penicillins      REACTION: anaphylaxis    Current Outpatient Prescriptions on File Prior to Visit  Medication Sig Dispense Refill  . warfarin (COUMADIN) 5 MG tablet Take by mouth as directed.          BP 120/80  Pulse 64  Temp(Src) 97.6 F (36.4 C) (Oral)  Resp 16  Ht 5\' 9"  (1.753 m)  Wt 167 lb (75.751 kg)  BMI 24.66 kg/m2  SpO2 97%    Objective:   Physical Exam  Constitutional: He appears well-developed.  Cardiovascular: Normal rate and normal heart sounds.   Pulmonary/Chest: Breath sounds normal. He has no wheezes. He has no rales.  Musculoskeletal:       Feet:       Mild discomfort with plantar and dorsiflexion of left foot.  No redness or swelling.  Mild post heel tenderness          Assessment & Plan:

## 2010-10-31 NOTE — Assessment & Plan Note (Signed)
New onset left foot pain. I suspect symptoms from osteoarthritis X ray of foot negative for stress fracture.  No evidence of fracture or dislocation. Osteopenic  appearance of bones. Pes planus configuration. Degenerative  spurring of the dorsal aspect of the navicular at the talonavicular  joint. Posterior and plantar calcaneal spurring. Large osteophyte  arising from the proximal lateral aspect of the proximal phalanx of  the first toe. Trial of topical anti inflammatory and arch support insert.  If no improvement, we discussed referral to podiatrist - Dr. Ralene Cork

## 2010-11-16 ENCOUNTER — Ambulatory Visit (INDEPENDENT_AMBULATORY_CARE_PROVIDER_SITE_OTHER): Payer: Medicare Other | Admitting: *Deleted

## 2010-11-16 DIAGNOSIS — I4891 Unspecified atrial fibrillation: Secondary | ICD-10-CM

## 2010-11-16 DIAGNOSIS — Z7901 Long term (current) use of anticoagulants: Secondary | ICD-10-CM | POA: Insufficient documentation

## 2010-12-08 NOTE — Assessment & Plan Note (Signed)
[facility]                            CARDIOLOGY OFFICE NOTE   NAME:Geoffrey Romero, Geoffrey Romero                      MRN:          [id number]  DATE:02/01/2007                            DOB:          September 18, 1923    [name] is a very pleasant 77 year old gentleman who has a history of  paroxysmal atrial fibrillation and preserved LF function.  He also has  hypertension.  Since I last saw him, he is doing extremely well.  There  is no dyspnea on exertion, orthopnea, PND, pedal edema, presyncope,  syncope, or exertional chest pain.  He does continue to have occasional  brief runs of palpitations that are unchanged.  Note, there is no  bleeding.   His medications at present include:  1. Coumadin as directed.  2. Vitamin B complex.  3. Cardizem 60 p.o. b.i.d.  4. Fish oil.  5. Proscar 5 mg p.o. daily.  6. Multivitamin.  7. Vitamin C.  8. Proamatine as needed.  9. Ocuvite.   PHYSICAL EXAMINATION:  Shows a blood pressure of 146/68.  His pulse is  58.  He weighs 180 pounds.  HEENT:  Normal.  NECK:  Supple with no bruits.  CHEST:  Clear.  CARDIOVASCULAR EXAM:  Reveals a regular rate and rhythm.  ABDOMINAL EXAM:  Shows no pulsatile masses.  No bruits.  EXTREMITIES:  Show no edema.  ELECTROCARDIOGRAM:  Shows a sinus rhythm at a rate of 62.  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  There is poor R wave progression.  A prior  septal infarct cannot be excluded.   DIAGNOSES:  1. Paroxysmal atrial fibrillation:  The patient remains in sinus      rhythm.  We will continue with his present dose of Cardizem.  Note,      with higher doses of Cardizem in the past, he has developed      bradycardia.  We will also continue on Coumadin with a goal INR of      2 to 3, and this is being monitored in our Coumadin Clinic.  2. Coumadin therapy:  Being monitored in the Coumadin Clinic.  We will      check a CBC today to follow his hemoglobin and hematocrit.  3.  Hypertension:  We will continue to track this and increase his      medications as indicated.  4. Hyperlipidemia:  Per his primary care.  5. History of sinus bradycardia on higher doses of Cardizem.   We will see him back in 1 year.     Madolyn Frieze Jens Som, MD, [facility]  Electronically Signed    BSC/MedQ  DD: 02/01/2007  DT: 02/01/2007  Job #: [id number]

## 2010-12-08 NOTE — Assessment & Plan Note (Signed)
[facility]                            CARDIOLOGY OFFICE NOTE   NAME:Cermak, SHAHZAIN KIESTER                      MRN:          [id number]  DATE:01/25/2008                            DOB:          07/14/1924    Mr. Kenyon is a very pleasant 77 year old gentleman who has a history of  paroxysmal atrial fibrillation and preserved LV function.  Since I last  saw him, he has had 1 episode of palpitations that lasted for short  amount of time.  There was no associated chest pain, shortness of  breath, or syncope.  He took an extra Cardizem and his symptoms  resolved.  He states his heart rate was going at approximately 159.  He  otherwise has felt well.  He does not have any bleeding.   MEDICATIONS:  1. Cardizem 30 mg p.o. daily.  2. Proscar 5 mg p.o. daily.  3. Coumadin as directed.  4. Fish oil.  5. Multivitamin.  6. Vitamin C.  7. Vitamin B complex.   PHYSICAL EXAMINATION:  VITAL SIGNS: Blood pressure 148/71.  His pulse is  59.  HEENT:  Normal.  NECK:  Supple.  CHEST:  Clear.  CARDIOVASCULAR:  He has bradycardic rate, but regular rhythm.  ABDOMEN:  No tenderness.  EXTREMITIES:  No edema.   An electrocardiogram today shows a sinus rhythm at a rate of 59.  There  is a left anterior fascicular block.  There are no ST changes noted.   DIAGNOSES:  1. Paroxysmal atrial fibrillation - Mr. Binford is having what sounds to      be brief episodes of atrial fibrillation only rarely.  I therefore      think we continue with present medications including Coumadin (his      CHADS2 score is 2 for age greater than 66 and hypertension) as well      as his low-dose Cardizem.  Note, he has had bradycardia with higher      doses of Cardizem in the past.  2. Coumadin therapy - This will continue to be monitored at our      Coumadin Clinic.  We will make sure that Dr. Artist Pais has drawn a recent      CBC.  3. Hypertension - His blood pressures are mildly elevated.  I have    asked him to track his blood pressure at home and to keep records      for Dr. Artist Pais.  Additional medications that do not block the AV node      could be added as needed.  4. Hyperlipidemia - Management per Dr. Artist Pais.  5. History of sinus bradycardia, on higher doses of Cardizem.   We will see him back in 1 year.     Madolyn Frieze Jens Som, MD, [facility]  Electronically Signed    BSC/MedQ  DD: 01/25/2008  DT: 01/26/2008  Job #: [id number]

## 2010-12-11 NOTE — Discharge Summary (Signed)
   NAME:  Geoffrey Romero, Geoffrey Romero                         ACCOUNT NO.:  [id number]   MEDICAL RECORD NO.:  [id number]                   PATIENT TYPE:  INP   LOCATION:  3006                                 FACILITY:  [facility]   PHYSICIAN:  Donalee Citrin, M.D.                     DATE OF BIRTH:  1924-01-25   DATE OF ADMISSION:  10/12/2002  DATE OF DISCHARGE:  10/15/2002                                 DISCHARGE SUMMARY   ADMISSION DIAGNOSIS:  Spondylolisthesis, L4-5.   PROCEDURE:  Decompressive laminectomy and fusion, L4-5.   HISTORY OF PRESENT ILLNESS:  The patient is a very pleasant 77 year old  gentleman who had longstanding back and leg pain refractory to conservative  treatment.   HOSPITAL COURSE:  The patient was admitted, and he underwent the  aforementioned procedure.  The patient postop did very well and went to the  recovery room and the floor.  On the floor the patient was progressively  mobilized, had complete resolution of his leg pain.  His back was stiff.  Was maintained on IV Demerol as well as p.o. Percocet.  Over the next couple  of days the patient was progressively mobilized with physical therapy.  His  drain was able to be taken out on day 2 and by the time of discharge, the  patient was neurologically stable with no leg pain, back pain well-  controlled on pills.  Discharged home with a dry wound, ambulating, and  voiding spontaneously.                                               Donalee Citrin, M.D.    GC/MEDQ  D:  11/30/2002  T:  11/30/2002  Job:  [id number]

## 2010-12-11 NOTE — Op Note (Signed)
NAME:  Geoffrey Romero, Geoffrey Romero                         ACCOUNT NO.:  [id number]   MEDICAL RECORD NO.:  [id number]                   PATIENT TYPE:  INP   LOCATION:  3006                                 FACILITY:  [facility]   PHYSICIAN:  Donalee Citrin, M.D.                     DATE OF BIRTH:  09-11-1923   DATE OF PROCEDURE:  10/12/2002  DATE OF DISCHARGE:                                 OPERATIVE REPORT   PREOPERATIVE DIAGNOSIS:  Degenerative spondylolisthesis, L4-5, with severe  spinal stenosis, L4-5.   PROCEDURES:  1. Decompressive laminectomy, L4-5.  2. Posterior lumbar interbody fusion, L4-5, using 10 x 26 mm Tangent     allograft wedges.  3. Pedicle screw fixation, L4-5, using the M10 pedicle screw system with 6.5     x 45 screws inserted at L4 and L5.  4. Posterolateral arthrodesis.  5. Placement of a medium Hemovac drain.   SURGEON:  Donalee Citrin, M.D.   ASSISTANT:  Alvis Lemmings, M.D.   ANESTHESIA:  General endotracheal.   CLINICAL NOTE:  The patient is a very pleasant 77 year old gentleman who has  had long-standing back and leg pain radiating down the top of his foot and  the big toe as well as the lateral aspect of his calf and the front of his  shin, consistent with an L4 and L5 radiculopathy.  Preoperative imaging  showed severe spinal stenosis, L4-5, with degenerative spondylolisthesis.  The patient had failed conservative treatment and was recommended  decompressive stabilization procedure.  I extensively went over the risks  and benefits of surgery with him.  He understands and agreed to proceed  forward.   DESCRIPTION OF PROCEDURE:  The patient was brought into the OR, was induced  under general anesthesia, positioned prone on the Wilson frame.  The back  was prepped and draped in the usual sterile fashion.  After infiltrating the  skin with 10 mL of lidocaine with epinephrine, a midline incision was made,  Bovie electrocautery was used to take down to the subcutaneous  tissue and  periosteal dissection carried out on the lamina of L4 and L5.  Intraoperative x-ray confirmed localization of the L4 pedicle and a self-  retaining retractor was placed.  All TPs were exposed of L4 and L5 and then  using a high-speed drill, the medial aspect of the facet complex was drilled  down and then the laminotomy was done using a Leksell rongeur.  The spinous  process was removed and the entire lamina and medial aspect of the facet  complex at L4.  Then using a 2 and 3 mm Kerrison punch, the L4 nerve root  was identified and radically decompressed out its foramen.  Then this was  extended caudally, removing the hypertrophied ligamentum flavum, which was  causing severe spinal stenosis and compression of the L5 nerve root as well  as the L4 nerve root, as  well as the large hypertrophied degenerative facet.  This was all removed in piecemeal fashion, radically decompressing both L4  and L5 nerve roots.  Then the epidural veins were coagulated over the disk  space and an annulotomy was made on the left side, and disk was radically  cleaned out using a size 10 distractor.  Fluoroscopy confirmed this to be  appropriate size for the wedges.  Then on the right side using a size 10  cutter and chisel, the end plate was prepared and the disk space was  prepared to receive the bone graft, and a 10 x 26 mm Tangent allograft was  inserted 1-2 mm deep to the posterior vertebral body line.  Then on the left  side the adjacent side was cleaned out and again using a size 8 cutter and  chisel, the disk space was prepared to receive the bone graft.  An Epstein  curette was used to scrape off the central end plates, and then the locally-  harvested autograft was packed with the allograft on the right side, then  the left side, and the Tangent allograft was inserted.  Fluoroscopy  confirmed the depth and trajectory at each step along the way.  Then  attention was placed to screw placement.   Using the high-speed drill, pilot  holes were drilled, the pedicle was cannulated with the awl, with  fluoroscopy confirming depth and trajectory as well as direct  intracanalicular inspection confirmed position of the pedicle and no medial  breech.  The pedicle was cannulated, tapped with a 5.5 tap, and 6.5 x 40  pedicle screws inserted at L4.  This procedure was repeated at L5 on the  left as well as L4 and L5 on the right.  The TPs and lateral gutters were  aggressively decorticated.  The remainder of the locally-harvested autograft  was packed against the lateral gutters.  A size 40 rod was inserted, top-  tightening nuts were tightened down at L5, the L4 pedicle screw was  compressed against L5.  Postop fluoroscopy confirmed good position of bone  grafts, rods, and screws.  Then the neural foramina were re-explored and  noted to be widely decompressed.  Gelfoam was overlaid on top of the dura.  The muscle and fascia were reapproximated with 0 interrupted Vicryl after  placement of a medium Hemovac drain, and the subcutaneous tissue was closed  with 2-0 interrupted Vicryl and the skin was closed in a running 4-0  subcuticular.  Benzoin and Steri-Strips were applied.  The patient went to  the recovery room in stable condition.  At the end of the case, all needle  counts and sponge counts were correct.                                               Donalee Citrin, M.D.    GC/MEDQ  D:  10/12/2002  T:  10/13/2002  Job:  [id number]

## 2010-12-11 NOTE — Op Note (Signed)
NAMESEVON, ROTERT               ACCOUNT NO.:  [id number]   MEDICAL RECORD NO.:  [id number]          PATIENT TYPE:  AMB   LOCATION:  SDS                          FACILITY:  [facility]   PHYSICIAN:  Vanita Panda. Magnus Ivan, M.D.DATE OF BIRTH:  [birth year]   DATE OF PROCEDURE:  07/29/2006  DATE OF DISCHARGE:                               OPERATIVE REPORT   PREOPERATIVE DIAGNOSIS:  Left knee degenerative joint disease with  internal derangement.   POSTOPERATIVE DIAGNOSIS:  Left knee degenerative joint disease with  medial and lateral chronic meniscal tearing.   PROCEDURE:  Left knee arthroscopy, debridement, including partial medial  and lateral meniscectomy.   SURGEON:  Vanita Panda. Magnus Ivan, M.D.   ANESTHESIA:  Left local knee block with IV sedation.   BLOOD LOSS:  Minimal.   ANTIBIOTICS:  600 mg IV clindamycin.   COMPLICATIONS:  None.   INDICATIONS:  Briefly, Mr. Orosz is an 77 year old old avid bowler who  has had left knee pain with locking and catching and MRI had been  obtained by his primary care physician that showed chronic medial and  lateral meniscal tearing and mild tricompartmental arthritis.  His plain  films showed only mild arthritis as well.  He is a quite active  individual and wanted to pursue arthroscopy because this helped on his  other knee as well at an earlier date.  The risks and benefits of this  were explained to him alone and he understood and agreed to proceed with  surgery.   DESCRIPTION OF PROCEDURE:  After informed was obtained, the appropriate  left leg was marked, a knee block was obtained by anesthesia and he was  brought to the operating room and placed supine on the operating table.  The IV anesthesia was then obtained.  A nonsterile tourniquet was placed  around his upper left thigh but was never utilized during the case.  His  leg was prepped and draped with DuraPrep and sterile drapes including  sterile stockinette.  Prior to making  the incision, a time-out was  called and he was  identified as the correct patient and the correct  extremity.  I then made anterior lateral portal just inferior to the  level of the inferior pole of patella and lateral patellar tendon.  Arthroscope was inserted.  I medially made an anterior medial portal at  the same level.  Probe was inserted and there was found to be chronic  posterior horn with meniscal tearing of the medial meniscus as well as  mild fraying of the lateral meniscus.  The ACL was intact.  There were  grade II to grade III chondromalacia changes along the medial femoral  condyle and the medial tibial plateau but the lateral compartment was  intact in terms of the cartilage on the bone.  There was grade III  chondromalacia underneath the patella.  Arthroscopic shaver was inserted  and debridement was carried out the knee including chondroplasty  underneath the patella and the medial femoral condyle and the medial  tibial plateau.  I also used the shaver to debride posterior horn,  tearing of the  medial meniscus as well as radial tearing of the lateral  meniscus.  I used up-cutting biters as well to smooth out the edge of  medial meniscus.  I then probed this and this was found to be stable and  the other components were stable as well.  I then allowed fluid to  lavage through the knee and then removed all instrumentation.  I drained  effusion from the knee and then inserted a mixture of Marcaine,  epinephrine and 4 mg of morphine into the knee.  Portal sites were  closed with interrupted 4-0 nylon suture followed by Xeroforms and well-  padded sterile dressings.  He was taken to the recovery room in stable  condition.  There were no complications noted.  Final counts were  correct.  Postoperatively, I will allow him to elevate  and ice but  weightbear as tolerated and gradually increase his activities over the  next few days.           ______________________________   Vanita Panda. Magnus Ivan, M.D.     CYB/MEDQ  D:  07/29/2006  T:  07/29/2006  Job:  [id number]

## 2010-12-11 NOTE — Op Note (Signed)
NAMECREG, GILMER               ACCOUNT NO.:  [id number]   MEDICAL RECORD NO.:  [id number]          PATIENT TYPE:  AMB   LOCATION:  NESC                         FACILITY:  [facility]   PHYSICIAN:  Boston Service, M.D.DATE OF BIRTH:  1924/06/16   DATE OF PROCEDURE:  01/12/2005  DATE OF DISCHARGE:                                 OPERATIVE REPORT   CARDIOLOGY:  Olga Millers, M.D.   INTERNAL MEDICINE:  [facility].   PREOPERATIVE DIAGNOSES:  Balanitis phimosis, preputial adhesions.   POSTOPERATIVE DIAGNOSES:  Balanitis phimosis, preputial adhesions.   PROCEDURE:  Circumcision.   ANESTHESIA:  General.   DRAINS:  None.   COMPLICATIONS:  None.   SURGEON:  Boston Service, M.D.   DESCRIPTION OF PROCEDURE:  The patient had obtained preoperative cardiology  clearance by Olga Millers, M.D. January 05, 2005, off Coumadin x6 days,  presents to the OR for circumcision. In the supine position after  institution of an adequate level of general anesthesia, the patient was  appropriately prepped and draped. A circumferential incision was made  proximal to the subcarinal sulcus after several dense fibrotic preputial  adhesions had been taken down bluntly and smegma had been cleared from the  subcarinal sulcus. A similar incision was then made proximal to the original  incision and a ring of edematous fibrotic preputial skin was removed in a  parallel lines technique. Great care was taken to cauterize small bleeding  sites with the Bovie. Subcu was then reapproximated with interrupted sutures  of 3-0 Vicryl. Skin was then reapproximated with closely spaced stitches of  4-0 chromic. The wound was covered with bacitracin ointment, dry gauze and  Coban tape. The patient was returned to recovery in satisfactory condition.       RH/MEDQ  D:  01/12/2005  T:  01/12/2005  Job:  [id number]   cc:   Olga Millers, M.D. [facility]

## 2010-12-11 NOTE — Op Note (Signed)
[name], Geoffrey Romero               ACCOUNT NO.:  [id number]   MEDICAL RECORD NO.:  [id number]          PATIENT TYPE:  AMB   LOCATION:  SDS                          FACILITY:  [facility]   PHYSICIAN:  Donalee Citrin, M.D.        DATE OF BIRTH:  Aug 23, 1923   DATE OF PROCEDURE:  12/22/2005  DATE OF DISCHARGE:  12/22/2005                                 OPERATIVE REPORT   PREOPERATIVE DIAGNOSIS:  Right carpal tunnel syndrome.   PROCEDURE:  Right carpal tunnel release.   SURGEON:  Donalee Citrin, M.D.   ASSISTANT:  None.   ANESTHESIA:  Bier block.   HPI:  The patient is a 77 year old gentleman who has had bilateral carpal  tunnel release and underwent left carpal tunnel release approximately 6-8  weeks ago and did very well, had EMG documented evidence of severe right  carpal tunnel syndrome as well and, after recovering from the left side, we  performed the right.  He had failed all conservative treatment with bracing.  Patient was on Coumadin and stopped this 5 days prior to admission.  PT and  PTT were checked and were within normal limits.  The patient was taken to  the operating room, induced under Bier block anesthesia, tourniquet was  applied at 1421 and was released at 1452.  Incision time was 1435 and  finished and dressed at 1455.   OPERATIVE PROCEDURE:  After Bier block anesthesia had been induced, an  incision was drawn from the distal crease of the wrist along the palmar  crease to the point centered between the second and third fingers, and this  incision was approximately 3-4 cm long.  It was incised with a 15-blade  scalpel.  Matson retractor was placed.  The fascia was dissected free and  the 15-blade scalpel was used to dissect layer by layer through the  transverse carpal ligament __________ the median nerve was then visualized,  then using a small mosquito, the plane between the undersurface of the  transverse carpal ligament and the median nerve was developed.  The median  carpal ligament was then divided both proximally and distally until the  hemostat was easily passed both proximally and distally without adhesions or  evidence of stenosis from any residual transverse carpal ligament.  The  wound was then copiously irrigated and an interrupted vertical mattress  suture was placed in the skin, approximating the skin edges, taking care not  to bring them in too tightly.  The tourniquet was released and, again,  tourniquet time was approximately 31 minutes, and the wound was dressed with  bacitracin, Adaptic, fluffs, Kerlix roll and Ace wrap, and the patient was  sent to recovery room in stable condition.  At the end of the case needle  and sponge counts were correct.           ______________________________  Donalee Citrin, M.D.     GC/MEDQ  D:  12/22/2005  T:  12/22/2005  Job:  [id number]

## 2010-12-11 NOTE — Assessment & Plan Note (Signed)
[facility]                              CARDIOLOGY OFFICE NOTE   Geoffrey Romero, Geoffrey Romero                        MRN:          [id number]  DATE:02/01/2006                            DOB:          May 03, 1924    REFERRING PHYSICIAN:  BRIAN S CRENSHAW   The patient returns for followup today.  He has a history of paroxysmal  atrial fibrillation.  Since I last saw him there is no dyspnea, chest pain  rule out syncope.  There is no pedal edema.  He does continue to have  occasional palpitations without any other symptoms.  His medications include  vitamin B complex, Coumadin as directed, Cardizem 30 mg tablets 2 by mouth  twice daily, fish oil, Proscar, multivitamin, vitamin C,  Lovastatin,  Clemastin, and Ocuvite.   PHYSICAL EXAMINATION:  His physical exam today shows a blood pressure of  153/72 and his pulse is 56.  NECK:  Supple with no bruits.  CHEST:  Clear.  CARDIOVASCULAR:  Exam reveals regular rate and rhythm.  EXTREMITIES:  No edema.  ABDOMEN:  No pulsatile masses, no bruits.   DIAGNOSES:  1.  History of paroxysmal atrial fibrillation.  2.  Coumadin therapy.  3.  History of hypertension.  4.  History of sinus bradycardia on high doses of Cardizem.  5.  Hyperlipidemia.   PLAN:  [name] is doing well from a symptomatic standpoint. He continues  to have occasional palpitations that certainly could represent atrial  fibrillation that is paroxysmal.  We will continue his Coumadin with a goal  INR of 2 to 3 as he has embolic risk factors of age greater than 60 and  hypertension.  Will also continue on his Cardizem. His blood pressure is  elevated today and I have asked him to track it at home.  If his systolic  runs greater than 130 or his diastolic is greater than 85, then we could add  additional medications for his blood pressure such as hydrochlorothiazide.  We will see him back in one year.  He will contact us about his blood   pressure.                              Madolyn Frieze Jens Som, MD, [facility]    BSC/MedQ  DD:  02/01/2006  DT:  02/01/2006  Job #:  [id number]

## 2010-12-11 NOTE — Op Note (Signed)
NAMEJOHNATAN, BASKETTE               ACCOUNT NO.:  [id number]   MEDICAL RECORD NO.:  [id number]          PATIENT TYPE:  AMB   LOCATION:  SDS                          FACILITY:  [facility]   PHYSICIAN:  Donalee Citrin, M.D.        DATE OF BIRTH:  1924/07/17   DATE OF PROCEDURE:  08/30/2005  DATE OF DISCHARGE:                                 OPERATIVE REPORT   PREOPERATIVE DIAGNOSIS:  Left carpal tunnel syndrome.   PROCEDURE:  Left carpal tunnel release.   SURGEON:  Donalee Citrin, M.D.   ANESTHESIA:  Bier block.   HISTORY OF PRESENT ILLNESS:  The patient is a very pleasant 77 year old  gentleman who has had progressive worsening hand pain, numbness and weakness  in his wrist with impaired action.  EMG showed severe carpal tunnel syndrome  with median nerve compression.  The patient was recommended for carpal  tunnel release.  The risks and benefits were explained to the patient, he  understands; and agrees to proceed forward.   DESCRIPTION OF PROCEDURE:  The patient was brought to the operating room,  and was induced under Bier-block anesthesia; tourniquet time being 26  minutes. After the hand was prepped and draped in the usual sterile fashion  an incision was made from the distal crease along the palmar crease to  inline with the middle finger approximately 4 cm long.  An incision was made  with the 15-blade scalpel, then this dissection was carried down to the  flexor retinaculum and transverse carpal ligament.  This was divided with a  15-blade scalpel in layers until the epineural sheath of the median nerve  was visualized.  Then using a hemostat, the plane for underneath the  transverse carpal ligament overlying the medial neural sheath was developed;  and using a 15-blade scalpel the ligament was divided decompressing the  median nerve.  This was carried out both proximally and distally until no  further stenosis was appreciated with the hemostats easily passing along the  surface of the  median nerve sheath.   Then the wound was copiously irrigated and the skin was reapproximated with  interrupted vertical mattress sutures with nylon.  Bacitracin was dressed,  Adaptic, Kerlix fluffs, Kerlix roll, and an Ace wrap.  The patient was then  taken to the recovery room in stable condition.           ______________________________  Donalee Citrin, M.D.     GC/MEDQ  D:  08/30/2005  T:  08/30/2005  Job:  [id number]

## 2010-12-28 ENCOUNTER — Ambulatory Visit (INDEPENDENT_AMBULATORY_CARE_PROVIDER_SITE_OTHER): Payer: Medicare Other | Admitting: *Deleted

## 2010-12-28 DIAGNOSIS — I4891 Unspecified atrial fibrillation: Secondary | ICD-10-CM

## 2011-01-25 ENCOUNTER — Ambulatory Visit (INDEPENDENT_AMBULATORY_CARE_PROVIDER_SITE_OTHER): Payer: Medicare Other | Admitting: *Deleted

## 2011-01-25 DIAGNOSIS — I4891 Unspecified atrial fibrillation: Secondary | ICD-10-CM

## 2011-01-25 LAB — POCT INR: INR: 2.3

## 2011-02-24 ENCOUNTER — Other Ambulatory Visit: Payer: Self-pay | Admitting: Pharmacist

## 2011-02-24 MED ORDER — WARFARIN SODIUM 5 MG PO TABS: ORAL_TABLET | ORAL | Status: DC

## 2011-03-08 ENCOUNTER — Ambulatory Visit (INDEPENDENT_AMBULATORY_CARE_PROVIDER_SITE_OTHER): Payer: Medicare Other | Admitting: *Deleted

## 2011-03-08 DIAGNOSIS — I4891 Unspecified atrial fibrillation: Secondary | ICD-10-CM

## 2011-03-08 LAB — POCT INR: INR: 2.2

## 2011-04-19 ENCOUNTER — Ambulatory Visit (INDEPENDENT_AMBULATORY_CARE_PROVIDER_SITE_OTHER): Payer: Medicare Other | Admitting: *Deleted

## 2011-04-19 DIAGNOSIS — I4891 Unspecified atrial fibrillation: Secondary | ICD-10-CM

## 2011-04-19 LAB — POCT INR: INR: 2.3

## 2011-04-23 ENCOUNTER — Telehealth: Payer: Self-pay | Admitting: Internal Medicine

## 2011-04-23 MED ORDER — FINASTERIDE 5 MG PO TABS: 5.0000 mg | ORAL_TABLET | Freq: Every day | ORAL | Status: DC

## 2011-04-23 NOTE — Telephone Encounter (Signed)
rx sent in electronically to [facility] pharmacy

## 2011-04-23 NOTE — Telephone Encounter (Signed)
Refill- finasteride 5mg  tab. Take one tablet by mouth every day. Qty 30. Last fill 2.9.10

## 2011-05-31 ENCOUNTER — Ambulatory Visit (INDEPENDENT_AMBULATORY_CARE_PROVIDER_SITE_OTHER): Payer: Medicare Other | Admitting: *Deleted

## 2011-05-31 DIAGNOSIS — I4891 Unspecified atrial fibrillation: Secondary | ICD-10-CM

## 2011-05-31 DIAGNOSIS — Z7901 Long term (current) use of anticoagulants: Secondary | ICD-10-CM

## 2011-05-31 LAB — POCT INR: INR: 2

## 2011-06-02 ENCOUNTER — Ambulatory Visit (INDEPENDENT_AMBULATORY_CARE_PROVIDER_SITE_OTHER): Payer: Medicare Other

## 2011-06-02 ENCOUNTER — Other Ambulatory Visit: Payer: Self-pay | Admitting: Cardiology

## 2011-06-02 DIAGNOSIS — Z23 Encounter for immunization: Secondary | ICD-10-CM

## 2011-06-02 NOTE — Telephone Encounter (Signed)
rx request 

## 2011-07-12 ENCOUNTER — Ambulatory Visit (INDEPENDENT_AMBULATORY_CARE_PROVIDER_SITE_OTHER): Payer: Medicare Other | Admitting: *Deleted

## 2011-07-12 DIAGNOSIS — Z7901 Long term (current) use of anticoagulants: Secondary | ICD-10-CM

## 2011-07-12 DIAGNOSIS — I4891 Unspecified atrial fibrillation: Secondary | ICD-10-CM

## 2011-07-12 LAB — POCT INR: INR: 2.7

## 2011-08-16 ENCOUNTER — Ambulatory Visit (INDEPENDENT_AMBULATORY_CARE_PROVIDER_SITE_OTHER): Payer: Medicare Other | Admitting: Cardiology

## 2011-08-16 ENCOUNTER — Encounter: Payer: Self-pay | Admitting: Cardiology

## 2011-08-16 DIAGNOSIS — I10 Essential (primary) hypertension: Secondary | ICD-10-CM

## 2011-08-16 DIAGNOSIS — Z79899 Other long term (current) drug therapy: Secondary | ICD-10-CM

## 2011-08-16 DIAGNOSIS — E785 Hyperlipidemia, unspecified: Secondary | ICD-10-CM

## 2011-08-16 DIAGNOSIS — Z7901 Long term (current) use of anticoagulants: Secondary | ICD-10-CM

## 2011-08-16 DIAGNOSIS — R9431 Abnormal electrocardiogram [ECG] [EKG]: Secondary | ICD-10-CM | POA: Insufficient documentation

## 2011-08-16 DIAGNOSIS — R002 Palpitations: Secondary | ICD-10-CM

## 2011-08-16 LAB — PROTIME-INR
INR: 1.8 ratio — ABNORMAL HIGH (ref 0.8–1.0)
Prothrombin Time: 19.7 s — ABNORMAL HIGH (ref 10.2–12.4)

## 2011-08-16 LAB — CBC WITH DIFFERENTIAL/PLATELET
Basophils Absolute: 0 10*3/uL (ref 0.0–0.1)
Eosinophils Absolute: 0.1 10*3/uL (ref 0.0–0.7)
Lymphocytes Relative: 20.7 % (ref 12.0–46.0)
MCHC: 34.1 g/dL (ref 30.0–36.0)
Neutro Abs: 4.6 10*3/uL (ref 1.4–7.7)
Neutrophils Relative %: 68.2 % (ref 43.0–77.0)
RDW: 14.2 % (ref 11.5–14.6)

## 2011-08-16 LAB — APTT: aPTT: 34.2 s — ABNORMAL HIGH (ref 21.7–28.8)

## 2011-08-16 NOTE — Progress Notes (Signed)
HPI:Mr. Rodeheaver is a very pleasant  gentleman who has a history of paroxysmal atrial fibrillation and preserved LV function.  A stress only Cardiolite performed in January of 2002 showed normal perfusion. The ejection fraction was 63%.  We repeated an echocardiogram in July of 2010 that showed normal LV function, mild mitral regurgitation and mild right atrial enlargement. I last saw him in March of 2012. Since then over the past several months he has had 5 episodes of recurrent palpitations. He feels a flutter and also generalized weakness. The symptoms last one to 3 hours and resolve spontaneously. He checks his pulse with his blood pressure machine and states it's 150-200. He otherwise does not have dyspnea on exertion, orthopnea, PND or chest pain. No syncope or bleeding.  Current Outpatient Prescriptions  Medication Sig Dispense Refill  . Ascorbic Acid (VITAMIN C) 500 MG tablet Take 500 mg by mouth daily.        . B Complex Vitamins (B COMPLEX 1) tablet Take 1 tablet by mouth daily.        . Cholecalciferol (VITAMIN D3) 1000 UNITS tablet Take 1,000 Units by mouth daily.        . clemastine (TAVIST) 2.68 MG TABS Take 2.68 mg by mouth 2 (two) times daily.      . diclofenac sodium (VOLTAREN) 1 % GEL Apply 2 grams 3 x day  2 Tube  1  . diltiazem (CARDIZEM) 30 MG tablet Take 30 mg by mouth 4 (four) times daily.        . finasteride (PROSCAR) 5 MG tablet Take 1 tablet (5 mg total) by mouth daily.  30 tablet  3  . lisinopril (PRINIVIL,ZESTRIL) 5 MG tablet Take 2.5 mg by mouth daily.        . Multiple Vitamins-Minerals (OCUVITE PO) Take by mouth daily.        . Omega-3 Fatty Acids (FISH OIL) 1000 MG CAPS Take 1,000 capsules by mouth daily.        Marland Kitchen triamcinolone cream (KENALOG) 0.1 % Apply 1 application topically 4 (four) times daily.      Marland Kitchen warfarin (COUMADIN) 5 MG tablet TAKE AS DIRECTED PER ANTICOAGULATION    CLINIC  135 tablet  1     Past Medical History  Diagnosis Date  . Hypertension   .  Hyperlipidemia   . History of diverticulitis of colon     history of diverticulosis  . AF (atrial fibrillation)   . Long term current use of anticoagulant     anticoagulant therapy  . BPH (benign prostatic hyperplasia)   . Balanitis     phimosis, preputial adhesions  . CTS (carpal tunnel syndrome)   . History of sinus bradycardia     history of bradycardia on hig doses of Cardizem    Past Surgical History  Procedure Date  . Knee arthroscopy 07/2006    left knee debridement, including partial medial and lateral menisectomy  . Lumbar laminectomy/decompression microdiscectomy 2004    L4-5  . Circumcision 2006  . Carpal tunnel release 2007/2008    History   Social History  . Marital Status: Married    Spouse Name: N/A    Number of Children: N/A  . Years of Education: N/A   Occupational History  . Not on file.   Social History Main Topics  . Smoking status: Former Games developer  . Smokeless tobacco: Not on file  . Alcohol Use: Not on file  . Drug Use: Not on file  . Sexually  Active: Not on file   Other Topics Concern  . 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 Remaining systems are negative.  Physical Exam: Well-developed well-nourished in no acute distress.  Skin is warm and dry.  HEENT is normal.  Neck is supple. No thyromegaly.  Chest is clear to auscultation with normal expansion.  Cardiovascular exam is regular rate and rhythm. 1/6 systolic murmur apex Abdominal exam nontender or distended. No masses palpated. Extremities show no edema. neuro grossly intact  ECG sinus rhythm at a rate of 61. Prior inferior and septal infarct. IVCD.

## 2011-08-16 NOTE — Assessment & Plan Note (Signed)
Blood pressure controlled. Continue present medications. 

## 2011-08-16 NOTE — Progress Notes (Signed)
Addended by: Alma Friendly on: 08/16/2011 08:56 AM   Modules accepted: Orders

## 2011-08-16 NOTE — Assessment & Plan Note (Signed)
Patient having episodes of palpitations with elevated heart rate. Scheduled CardioNet to make sure this is atrial fibrillation. If so he will most likely require an antiarrhythmic such as flecainide to maintain sinus rhythm. Continue Cardizem. Continue Coumadin. Repeat echocardiogram. Check TSH. Check hemoglobin given Coumadin use. I will also check his INR today. Results to Coumadin clinic.

## 2011-08-16 NOTE — Assessment & Plan Note (Signed)
Cannot rule out prior septal and inferior infarct. Echocardiogram to quantify LV function and wall motion.

## 2011-08-16 NOTE — Assessment & Plan Note (Signed)
Management per primary care. 

## 2011-08-16 NOTE — Patient Instructions (Signed)
Your physician recommends that you schedule a follow-up appointment in: 6 WEEKS WITH DR  Jens Som Your physician recommends that you continue on your current medications as directed. Please refer to the Current Medication list given to you today. Your physician recommends that you return for lab work in: today  BMET CBC PT PTT  DX 785.1 V58.69 Your physician has requested that you have an echocardiogram. Echocardiography is a painless test that uses sound waves to create images of your heart. It provides your doctor with information about the size and shape of your heart and how well your heart's chambers and valves are working. This procedure takes approximately one hour. There are no restrictions for this procedure. DX 785.1 Your physician has recommended that you wear an event monitor. Event monitors are medical devices that record the heart's electrical activity. Doctors most often [place] these monitors to diagnose arrhythmias. Arrhythmias are problems with the speed or rhythm of the heartbeat. The monitor is a small, portable device. You can wear one while you do your normal daily activities. This is usually used to diagnose what is causing palpitations/syncope (passing out). DX 785.1

## 2011-08-17 ENCOUNTER — Ambulatory Visit (INDEPENDENT_AMBULATORY_CARE_PROVIDER_SITE_OTHER): Payer: Self-pay | Admitting: Cardiology

## 2011-08-17 DIAGNOSIS — Z7901 Long term (current) use of anticoagulants: Secondary | ICD-10-CM

## 2011-08-17 DIAGNOSIS — R0989 Other specified symptoms and signs involving the circulatory and respiratory systems: Secondary | ICD-10-CM

## 2011-08-17 DIAGNOSIS — I4891 Unspecified atrial fibrillation: Secondary | ICD-10-CM

## 2011-08-23 ENCOUNTER — Encounter: Payer: Medicare Other | Admitting: *Deleted

## 2011-08-25 ENCOUNTER — Ambulatory Visit (HOSPITAL_COMMUNITY): Payer: Medicare Other | Attending: Cardiology

## 2011-08-25 ENCOUNTER — Encounter (INDEPENDENT_AMBULATORY_CARE_PROVIDER_SITE_OTHER): Payer: Medicare Other

## 2011-08-25 DIAGNOSIS — R002 Palpitations: Secondary | ICD-10-CM

## 2011-08-25 DIAGNOSIS — I4891 Unspecified atrial fibrillation: Secondary | ICD-10-CM | POA: Insufficient documentation

## 2011-08-25 DIAGNOSIS — E785 Hyperlipidemia, unspecified: Secondary | ICD-10-CM | POA: Insufficient documentation

## 2011-08-25 DIAGNOSIS — I10 Essential (primary) hypertension: Secondary | ICD-10-CM | POA: Insufficient documentation

## 2011-08-25 DIAGNOSIS — R9431 Abnormal electrocardiogram [ECG] [EKG]: Secondary | ICD-10-CM

## 2011-09-13 ENCOUNTER — Ambulatory Visit (INDEPENDENT_AMBULATORY_CARE_PROVIDER_SITE_OTHER): Payer: Medicare Other | Admitting: Pharmacist

## 2011-09-13 DIAGNOSIS — Z7901 Long term (current) use of anticoagulants: Secondary | ICD-10-CM

## 2011-09-13 DIAGNOSIS — I4891 Unspecified atrial fibrillation: Secondary | ICD-10-CM

## 2011-09-14 ENCOUNTER — Encounter: Payer: Medicare Other | Admitting: *Deleted

## 2011-09-20 ENCOUNTER — Telehealth: Payer: Self-pay | Admitting: *Deleted

## 2011-09-20 NOTE — Telephone Encounter (Signed)
Spoke with pt wife, aware monitor reviewed by dr Jens Som shows atrial fib. They have a follow up appt 09-30-11 and will keep that appt to discuss further.

## 2011-09-30 ENCOUNTER — Encounter: Payer: Self-pay | Admitting: Cardiology

## 2011-09-30 ENCOUNTER — Ambulatory Visit (INDEPENDENT_AMBULATORY_CARE_PROVIDER_SITE_OTHER): Payer: Medicare Other | Admitting: Cardiology

## 2011-09-30 DIAGNOSIS — I4891 Unspecified atrial fibrillation: Secondary | ICD-10-CM

## 2011-09-30 DIAGNOSIS — Z79899 Other long term (current) drug therapy: Secondary | ICD-10-CM

## 2011-09-30 DIAGNOSIS — I10 Essential (primary) hypertension: Secondary | ICD-10-CM

## 2011-09-30 DIAGNOSIS — Z7901 Long term (current) use of anticoagulants: Secondary | ICD-10-CM

## 2011-09-30 DIAGNOSIS — E785 Hyperlipidemia, unspecified: Secondary | ICD-10-CM

## 2011-09-30 MED ORDER — FLECAINIDE ACETATE 100 MG PO TABS: 100.0000 mg | ORAL_TABLET | Freq: Two times a day (BID) | ORAL | Status: DC

## 2011-09-30 NOTE — Patient Instructions (Signed)
Your physician recommends that you schedule a follow-up appointment in: 8 WEEKS  START FLECAINIDE 100 MG TWICE DAILY  Your physician has requested that you have en exercise stress myoview. For further information please visit [email or web address] Please follow instruction sheet, as given.

## 2011-09-30 NOTE — Assessment & Plan Note (Signed)
Patient is having recurrent atrial fibrillation. His episodes are much more frequent and they are symptomatic. Plan continue Cardizem and Coumadin. Add flecainide 100 mg by mouth twice a day. Note he has no history of coronary artery disease. Hopefully flecainide will help maintain sinus rhythm. Schedule stress Myoview early next week to screen for coronary disease and to exclude exercise induced arrhythmias after adding flecainide. If he has recurrent symptoms despite flecainide will consider amiodarone.

## 2011-09-30 NOTE — Assessment & Plan Note (Signed)
Management per primary care. 

## 2011-09-30 NOTE — Progress Notes (Signed)
HPI: Mr. Zion is a very pleasant gentleman who has a history of paroxysmal atrial fibrillation and preserved LV function. A stress only Cardiolite performed in January of 2002 showed normal perfusion. The ejection fraction was 63%. We repeated an echocardiogram in Jan 2013 that showed normal LV function, grade 2 diastolic dysfunction, mild mitral regurgitation, mild to moderate PI and mild left atrial enlargement. FU monitor in Jan 2013 showed PAF. TSH normal. I last saw him in Jan of 2013. He was complaining of recurrent palpitations which we felt was most likely recurrent atrial fibrillation and was verified by the above monitor. Since then he continues to have problems with recurrent atrial fibrillation. He feels palpitations that last approximately 30 minutes. No associated chest pain, shortness of breath, syncope.   Current Outpatient Prescriptions  Medication Sig Dispense Refill  . Ascorbic Acid (VITAMIN C) 500 MG tablet Take 500 mg by mouth daily.        . B Complex Vitamins (B COMPLEX 1) tablet Take 1 tablet by mouth daily.        . Cholecalciferol (VITAMIN D3) 1000 UNITS tablet Take 1,000 Units by mouth daily.        . clemastine (TAVIST) 2.68 MG TABS Take 2.68 mg by mouth as needed.       . diclofenac sodium (VOLTAREN) 1 % GEL Apply 2 grams 3 x day  2 Tube  1  . diltiazem (CARDIZEM) 30 MG tablet Take 30 mg by mouth 3 (three) times daily.       . finasteride (PROSCAR) 5 MG tablet Take 1 tablet (5 mg total) by mouth daily.  30 tablet  3  . lisinopril (PRINIVIL,ZESTRIL) 5 MG tablet Take 2.5 mg by mouth daily.        . Multiple Vitamins-Minerals (OCUVITE PO) Take by mouth daily.        . Omega-3 Fatty Acids (FISH OIL) 1000 MG CAPS Take 1,000 capsules by mouth daily.        Marland Kitchen triamcinolone cream (KENALOG) 0.1 % Apply 1 application topically 4 (four) times daily.      Marland Kitchen warfarin (COUMADIN) 5 MG tablet TAKE AS DIRECTED PER ANTICOAGULATION    CLINIC  135 tablet  1     Past Medical History    Diagnosis Date  . Hypertension   . Hyperlipidemia   . History of diverticulitis of colon     history of diverticulosis  . AF (atrial fibrillation)   . Long term current use of anticoagulant     anticoagulant therapy  . BPH (benign prostatic hyperplasia)   . Balanitis     phimosis, preputial adhesions  . CTS (carpal tunnel syndrome)   . History of sinus bradycardia     history of bradycardia on hig doses of Cardizem    Past Surgical History  Procedure Date  . Knee arthroscopy 07/2006    left knee debridement, including partial medial and lateral menisectomy  . Lumbar laminectomy/decompression microdiscectomy 2004    L4-5  . Circumcision 2006  . Carpal tunnel release 2007/2008    History   Social History  . Marital Status: Married    Spouse Name: N/A    Number of Children: N/A  . Years of Education: N/A   Occupational History  . Not on file.   Social History Main Topics  . Smoking status: Former Games developer  . Smokeless tobacco: Not on file  . Alcohol Use: Not on file  . Drug Use: Not on file  . Sexually  Active: Not on file   Other Topics Concern  . 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 Remaining systems are negative.  Physical Exam: Well-developed well-nourished in no acute distress.  Skin is warm and dry.  HEENT is normal.  Neck is supple. No thyromegaly.  Chest is clear to auscultation with normal expansion.  Cardiovascular exam is regular rate and rhythm.  Abdominal exam nontender or distended. No masses palpated. Extremities show no edema. neuro grossly intact

## 2011-09-30 NOTE — Assessment & Plan Note (Signed)
Followed in the Coumadin clinic. Goal INR 2-3. 

## 2011-09-30 NOTE — Assessment & Plan Note (Signed)
Blood pressure controlled. Continue present medications. 

## 2011-10-06 ENCOUNTER — Ambulatory Visit (HOSPITAL_COMMUNITY): Payer: Medicare Other | Attending: Cardiology | Admitting: Radiology

## 2011-10-06 VITALS — BP 145/66 | Ht 70.0 in | Wt 170.0 lb

## 2011-10-06 DIAGNOSIS — R079 Chest pain, unspecified: Secondary | ICD-10-CM | POA: Insufficient documentation

## 2011-10-06 DIAGNOSIS — I447 Left bundle-branch block, unspecified: Secondary | ICD-10-CM

## 2011-10-06 DIAGNOSIS — I10 Essential (primary) hypertension: Secondary | ICD-10-CM | POA: Insufficient documentation

## 2011-10-06 DIAGNOSIS — Z7901 Long term (current) use of anticoagulants: Secondary | ICD-10-CM

## 2011-10-06 DIAGNOSIS — E785 Hyperlipidemia, unspecified: Secondary | ICD-10-CM | POA: Insufficient documentation

## 2011-10-06 DIAGNOSIS — I4891 Unspecified atrial fibrillation: Secondary | ICD-10-CM | POA: Insufficient documentation

## 2011-10-06 DIAGNOSIS — J45909 Unspecified asthma, uncomplicated: Secondary | ICD-10-CM | POA: Insufficient documentation

## 2011-10-06 DIAGNOSIS — Z87891 Personal history of nicotine dependence: Secondary | ICD-10-CM | POA: Insufficient documentation

## 2011-10-06 MED ORDER — TECHNETIUM TC 99M TETROFOSMIN IV KIT
33.0000 | PACK | Freq: Once | INTRAVENOUS | Status: AC | PRN
  Administered 2011-10-06: 33 via INTRAVENOUS

## 2011-10-06 MED ORDER — TECHNETIUM TC 99M TETROFOSMIN IV KIT
11.0000 | PACK | Freq: Once | INTRAVENOUS | Status: AC | PRN
  Administered 2011-10-06: 11 via INTRAVENOUS

## 2011-10-06 MED ORDER — REGADENOSON 0.4 MG/5ML IV SOLN
0.4000 mg | Freq: Once | INTRAVENOUS | Status: AC
  Administered 2011-10-06: 0.4 mg via INTRAVENOUS

## 2011-10-06 NOTE — Progress Notes (Signed)
[facility] SITE 3 NUCLEAR MED [address] [place] [address] [phone]  Cardiology Nuclear Med Study  Geoffrey Romero is a 78 y.o. male [id number] March 13, 1924   Nuclear Med Background Indication for Stress Test:  Evaluation for Ischemia and to Exclude Arrhythmia after adding Flecainide. History:  Asthma and '02 HKV:[id number] mild apical thinning, EF=63%; 1/13 Echo:EF=60-65%; PAF Cardiac Risk Factors: History of Smoking, Hypertension and Lipids  Symptoms:  Chest Pain/ "Uncomfortable" (last episode of chest discomfort was about one week ago)   Nuclear Pre-Procedure Caffeine/Decaff Intake:  None NPO After: 7:00am   Lungs:  Clear.  O2 SAT 97% on RA. IV 0.9% NS with Angio Cath:  22g  IV Site: R Wrist  IV Started by:  Stanton Kidney, EMT-P  Chest Size (in):  40 Cup Size: n/a  Height: 5\' 10"  (1.778 m)  Weight:  170 lb (77.111 kg)  BMI:  Body mass index is 24.39 kg/(m^2). Tech Comments:  Med's taken as directed    Nuclear Med Study 1 or 2 day study: 1 day  Stress Test Type:  Lexiscan  Reading MD: Marca Ancona, MD  Order Authorizing Provider:  Olga Millers, MD  Resting Radionuclide: Technetium 38m Tetrofosmin  Resting Radionuclide Dose: 11.0 mCi   Stress Radionuclide:  Technetium 7m Tetrofosmin  Stress Radionuclide Dose: 33.0 mCi           Stress Protocol Rest HR: 47-48 Stress HR: 61  Rest BP: 145/66 Stress BP: 162/71  Exercise Time (min): n/a METS: n/a   Predicted Max HR: 135 bpm % Max HR: 45.19 bpm Rate Pressure Product: 9882   Dose of Adenosine (mg):  n/a Dose of Lexiscan: 0.4 mg  Dose of Atropine (mg): n/a Dose of Dobutamine: n/a mcg/kg/min (at max HR)  Stress Test Technologist: Smiley Houseman, CMA-N  Nuclear Technologist:  Domenic Polite, CNMT     Rest Procedure:  Myocardial perfusion imaging was performed at rest 45 minutes following the intravenous administration of Technetium 19m Tetrofosmin.  Rest ECG: New LBBB.  Stress Procedure:  Patient was initially scheduled to walk the treadmill utilizing the Bruce protocol, but due to a new LBBB he was changed to Lexiscan per Dr. Patty Sermons (DOD).   The patient received IV Lexiscan 0.4 mg over 15-seconds.  Technetium 57m Tetrofosmin injected at 30-seconds.  There were no significant changes with Lexiscan.  Quantitative spect images were obtained after a 45 minute delay.  Stress ECG: No significant change from baseline ECG  QPS Raw Data Images:  Normal; no motion artifact; normal heart/lung ratio. Stress Images:  Normal homogeneous uptake in all areas of the myocardium. Rest Images:  Normal homogeneous uptake in all areas of the myocardium. Subtraction (SDS):  There is no evidence of scar or ischemia. Transient Ischemic Dilatation (Normal <1.22):  1.05 Lung/Heart Ratio (Normal <0.45):  0..31  Quantitative Gated Spect Images QGS EDV:  105 ml QGS ESV:  48 ml QGS cine images:  NL LV Function; NL Wall Motion QGS EF: 54%  Impression Exercise Capacity:  Lexiscan with no exercise. BP Response:  Normal blood pressure response. Clinical Symptoms:  Sense of fullness ECG Impression:  LBBB, no change with infusion.  Comparison with Prior Nuclear Study: No significant change from previous study  Overall Impression:  Normal stress nuclear study.  [name] [employer]

## 2011-10-08 ENCOUNTER — Telehealth: Payer: Self-pay | Admitting: *Deleted

## 2011-10-08 NOTE — Telephone Encounter (Signed)
Spoke with pt wife regarding stress test results. She reports the pt told her he was only taking the flecainide once daily because his heart rate was in the 40's and he was tired. According to the chart he was started on 100 mg bid and is also taking cardizem. Will forward for dr crenshaw's review.

## 2011-10-08 NOTE — Telephone Encounter (Signed)
Pt wife aware of dr crenshaw's recommendations. 

## 2011-10-08 NOTE — Telephone Encounter (Signed)
DC cardizem and take flecanide BID Olga Millers

## 2011-10-11 ENCOUNTER — Ambulatory Visit (INDEPENDENT_AMBULATORY_CARE_PROVIDER_SITE_OTHER): Payer: Medicare Other | Admitting: Pharmacist

## 2011-10-11 DIAGNOSIS — I4891 Unspecified atrial fibrillation: Secondary | ICD-10-CM

## 2011-10-11 DIAGNOSIS — Z7901 Long term (current) use of anticoagulants: Secondary | ICD-10-CM

## 2011-10-11 LAB — POCT INR: INR: 4.4

## 2011-10-25 ENCOUNTER — Ambulatory Visit (INDEPENDENT_AMBULATORY_CARE_PROVIDER_SITE_OTHER): Payer: Medicare Other | Admitting: *Deleted

## 2011-10-25 DIAGNOSIS — Z7901 Long term (current) use of anticoagulants: Secondary | ICD-10-CM

## 2011-10-25 DIAGNOSIS — I4891 Unspecified atrial fibrillation: Secondary | ICD-10-CM

## 2011-10-25 LAB — POCT INR: INR: 2

## 2011-11-25 ENCOUNTER — Ambulatory Visit (INDEPENDENT_AMBULATORY_CARE_PROVIDER_SITE_OTHER): Payer: Medicare Other | Admitting: Cardiology

## 2011-11-25 ENCOUNTER — Ambulatory Visit (INDEPENDENT_AMBULATORY_CARE_PROVIDER_SITE_OTHER): Payer: Medicare Other | Admitting: *Deleted

## 2011-11-25 ENCOUNTER — Encounter: Payer: Self-pay | Admitting: Cardiology

## 2011-11-25 VITALS — BP 137/68 | HR 55 | Ht 70.0 in | Wt 170.0 lb

## 2011-11-25 DIAGNOSIS — E785 Hyperlipidemia, unspecified: Secondary | ICD-10-CM

## 2011-11-25 DIAGNOSIS — I4891 Unspecified atrial fibrillation: Secondary | ICD-10-CM

## 2011-11-25 DIAGNOSIS — Z7901 Long term (current) use of anticoagulants: Secondary | ICD-10-CM

## 2011-11-25 DIAGNOSIS — I10 Essential (primary) hypertension: Secondary | ICD-10-CM

## 2011-11-25 NOTE — Assessment & Plan Note (Signed)
Management per primary care. 

## 2011-11-25 NOTE — Progress Notes (Signed)
HPI: Geoffrey Romero is a very pleasant gentleman who has a history of paroxysmal atrial fibrillation and preserved LV function. We repeated an echocardiogram in Jan 2013 that showed normal LV function, grade 2 diastolic dysfunction, mild mitral regurgitation, mild to moderate PI and mild left atrial enlargement. FU monitor in Jan 2013 showed PAF. TSH normal. I last saw him in March of 2013. We added flecainide for his atrial fibrillation. A followup lexiscan Myoview showed an ejection fraction of 54% and normal perfusion. Since I last saw him, there is no dyspnea, chest pain, palpitations or syncope. He has had no further episodes of atrial fibrillation.   Current Outpatient Prescriptions  Medication Sig Dispense Refill  . Ascorbic Acid (VITAMIN C) 500 MG tablet Take 500 mg by mouth daily.        . B Complex Vitamins (B COMPLEX 1) tablet Take 1 tablet by mouth daily.        . Cholecalciferol (VITAMIN D3) 1000 UNITS tablet Take 1,000 Units by mouth daily.        . clemastine (TAVIST) 2.68 MG TABS Take 2.68 mg by mouth as needed.       . diclofenac sodium (VOLTAREN) 1 % GEL Apply 2 grams 3 x day  2 Tube  1  . finasteride (PROSCAR) 5 MG tablet Take 1 tablet (5 mg total) by mouth daily.  30 tablet  3  . flecainide (TAMBOCOR) 100 MG tablet Take 1 tablet (100 mg total) by mouth 2 (two) times daily.  60 tablet  12  . lisinopril (PRINIVIL,ZESTRIL) 5 MG tablet Take 2.5 mg by mouth daily.        . Multiple Vitamins-Minerals (OCUVITE PO) Take by mouth daily.        . Omega-3 Fatty Acids (FISH OIL) 1000 MG CAPS Take 1,000 capsules by mouth daily.        Marland Kitchen triamcinolone cream (KENALOG) 0.1 % Apply 1 application topically 4 (four) times daily.      Marland Kitchen warfarin (COUMADIN) 5 MG tablet TAKE AS DIRECTED PER ANTICOAGULATION    CLINIC  135 tablet  1  . DISCONTD: diltiazem (CARDIZEM) 30 MG tablet Take 30 mg by mouth 3 (three) times daily.          Past Medical History  Diagnosis Date  . Hypertension   .  Hyperlipidemia   . History of diverticulitis of colon     history of diverticulosis  . AF (atrial fibrillation)   . Long term current use of anticoagulant     anticoagulant therapy  . BPH (benign prostatic hyperplasia)   . Balanitis     phimosis, preputial adhesions  . CTS (carpal tunnel syndrome)   . History of sinus bradycardia     history of bradycardia on hig doses of Cardizem    Past Surgical History  Procedure Date  . Knee arthroscopy 07/2006    left knee debridement, including partial medial and lateral menisectomy  . Lumbar laminectomy/decompression microdiscectomy 2004    L4-5  . Circumcision 2006  . Carpal tunnel release 2007/2008    History   Social History  . Marital Status: Married    Spouse Name: N/A    Number of Children: N/A  . Years of Education: N/A   Occupational History  . Not on file.   Social History Main Topics  . Smoking status: Former Games developer  . Smokeless tobacco: Not on file  . Alcohol Use: No  . Drug Use: No  . Sexually Active: Not on  file   Other Topics Concern  . 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 Remaining systems are negative.  Physical Exam: Well-developed well-nourished in no acute distress.  Skin is warm and dry.  HEENT is normal.  Neck is supple.  Chest is clear to auscultation with normal expansion.  Cardiovascular exam is bradycardic Abdominal exam nontender or distended. No masses palpated. Extremities show no edema. neuro grossly intact  ECG sinus rhythm at a rate of 51. First-degree AV block. Left bundle branch block.

## 2011-11-25 NOTE — Assessment & Plan Note (Signed)
Blood pressure controlled. Continue present medications. 

## 2011-11-25 NOTE — Assessment & Plan Note (Signed)
Patient in sinus rhythm today. He has had no bouts of atrial fibrillation since we added flecanide. His Myoview showed no ischemia. However it was a Pension scheme manager study. Plan exercise treadmill to exclude exercise-induced ventricular tachycardia since adding flecainide. Continue Coumadin.

## 2011-11-25 NOTE — Patient Instructions (Signed)
Your physician wants you to follow-up in: 6 MONTHS WITH DR Jens Som You will receive a reminder letter in the mail two months in advance. If you don't receive a letter, please call our office to schedule the follow-up appointment  Your physician has requested that you have an exercise tolerance test. For further information please visit [email or web address] Please also follow instruction sheet, as given.WITH SCOTT WEAVER PA FOR FLECAINIDE

## 2011-11-29 ENCOUNTER — Other Ambulatory Visit: Payer: Self-pay

## 2011-11-29 MED ORDER — WARFARIN SODIUM 5 MG PO TABS: ORAL_TABLET | ORAL | Status: DC

## 2011-12-03 ENCOUNTER — Encounter: Payer: Self-pay | Admitting: Nurse Practitioner

## 2011-12-03 ENCOUNTER — Ambulatory Visit (INDEPENDENT_AMBULATORY_CARE_PROVIDER_SITE_OTHER): Payer: Medicare Other | Admitting: Nurse Practitioner

## 2011-12-03 VITALS — BP 165/76 | HR 57 | Ht 70.0 in | Wt 170.0 lb

## 2011-12-03 DIAGNOSIS — I4891 Unspecified atrial fibrillation: Secondary | ICD-10-CM

## 2011-12-03 NOTE — Patient Instructions (Signed)
Continue with your current medicines.  We will see you back as scheduled in November.   Call the [facility] office at [phone] if you have any questions, problems or concerns.

## 2011-12-03 NOTE — Procedures (Signed)
Exercise Treadmill Test  Pre-Exercise Testing Evaluation Rhythm: normal sinus  Rate: 52   PR:  .24 QRS:  .15  QT:  .48 QTc: .45     Test  Exercise Tolerance Test Ordering MD: Olga Millers, MD  Interpreting MD: Norma Fredrickson NP  Unique Test No: 1  Treadmill:  1  Indication for ETT: Flecainide  Contraindication to ETT: No   Stress Modality: exercise - treadmill  Cardiac Imaging Performed: non   Protocol: standard Bruce - maximal  Max BP: 142/108  Max MPHR (bpm):   85% MPR (bpm):  114  MPHR obtained (bpm):  110 % MPHR obtained:  83%  Reached 85% MPHR (min:sec):  NA Total Exercise Time (min-sec):  5:00  Workload in METS:  7.0 Borg Scale: 15  Reason ETT Terminated:  patient's desire to stop    ST Segment Analysis At Rest: Patient has L BBB With Exercise: Patient has L BBB  Other Information Arrhythmia:  No Angina during ETT:  absent (0) Quality of ETT:  non-diagnostic  ETT Interpretation:  Non diagnostic. No arrhythmia. Patient with L BBB.   Comments: Patient exercised on the standard Bruce protocol for follow up of his Flecainide. He exercised for a total of 5 minutes on the standard Bruce protocol. Heart rate rose to a maximum of 110. Target was 114. He had fair exercise tolerance. He does have a resting left bundle branch block. There was no arrhythmia. Clinically he had no complaints of angina.   Recommendations: This study is non diagnostic but negative for pro arrhythmia from Flecainide. He will continue with his current therapy and we will see him back at his regular time in November. Patient is agreeable to this plan and will call if any problems develop in the interim.

## 2011-12-03 NOTE — Progress Notes (Signed)
Dierdre Searles Date of Birth: Feb 10, 1924 Medical Record #[id number]  History of Present Illness: Mr. Joslyn is seen today for routine GXT. He is seen for Dr. Jens Som. He is an 78 year old male with PAF, now on Flecainide. Has normal LV function. Has had normal Myoview. Does have grade 2 diastolic dysfunction.   He comes in today. He is here alone. He says he is doing well. No palpitations. Not lightheaded or dizzy. No chest pain. Not short of breath.   Current Outpatient Prescriptions on File Prior to Visit  Medication Sig Dispense Refill  . Ascorbic Acid (VITAMIN C) 500 MG tablet Take 500 mg by mouth daily.        . B Complex Vitamins (B COMPLEX 1) tablet Take 1 tablet by mouth daily.        . Cholecalciferol (VITAMIN D3) 1000 UNITS tablet Take 1,000 Units by mouth daily.        . clemastine (TAVIST) 2.68 MG TABS Take 2.68 mg by mouth as needed.       . diclofenac sodium (VOLTAREN) 1 % GEL Apply 2 grams 3 x day  2 Tube  1  . finasteride (PROSCAR) 5 MG tablet Take 1 tablet (5 mg total) by mouth daily.  30 tablet  3  . flecainide (TAMBOCOR) 100 MG tablet Take 1 tablet (100 mg total) by mouth 2 (two) times daily.  60 tablet  12  . lisinopril (PRINIVIL,ZESTRIL) 5 MG tablet Take 2.5 mg by mouth daily.        . Multiple Vitamins-Minerals (OCUVITE PO) Take by mouth daily.        . Omega-3 Fatty Acids (FISH OIL) 1000 MG CAPS Take 1,000 capsules by mouth daily.        Marland Kitchen triamcinolone cream (KENALOG) 0.1 % Apply 1 application topically 4 (four) times daily.      Marland Kitchen warfarin (COUMADIN) 5 MG tablet Take as directed by anticoagulation clinic  135 tablet  1  . DISCONTD: diltiazem (CARDIZEM) 30 MG tablet Take 30 mg by mouth 3 (three) times daily.         Allergies  Allergen Reactions  . Penicillins     REACTION: anaphylaxis    Past Medical History  Diagnosis Date  . Hypertension   . Hyperlipidemia   . History of diverticulitis of colon     history of diverticulosis  . PAF (paroxysmal  atrial fibrillation)     on flecainide  . Long term current use of anticoagulant     anticoagulant therapy  . BPH (benign prostatic hyperplasia)   . Balanitis     phimosis, preputial adhesions  . CTS (carpal tunnel syndrome)   . History of sinus bradycardia     history of bradycardia on hig doses of Cardizem  . Left bundle branch block     normal Myoview    Past Surgical History  Procedure Date  . Knee arthroscopy 07/2006    left knee debridement, including partial medial and lateral menisectomy  . Lumbar laminectomy/decompression microdiscectomy 2004    L4-5  . Circumcision 2006  . Carpal tunnel release 2007/2008    History  Smoking status  . Former Smoker  Smokeless tobacco  . Not on file    History  Alcohol Use No    Family History  Problem Relation Age of Onset  . Breast cancer Sister   . Stroke Mother   . Diabetes Mother     Review of Systems: The review of systems is  per the HPI.  All other systems were reviewed and are negative.  Physical Exam: BP 165/76  Pulse 57  Ht 5\' 10"  (1.778 m)  Wt 170 lb (77.111 kg)  BMI 24.39 kg/m2  LABORATORY DATA:   Assessment / Plan:

## 2011-12-27 ENCOUNTER — Ambulatory Visit (INDEPENDENT_AMBULATORY_CARE_PROVIDER_SITE_OTHER): Payer: Medicare Other

## 2011-12-27 DIAGNOSIS — I4891 Unspecified atrial fibrillation: Secondary | ICD-10-CM

## 2011-12-27 DIAGNOSIS — Z7901 Long term (current) use of anticoagulants: Secondary | ICD-10-CM

## 2011-12-27 LAB — POCT INR: INR: 2.1

## 2011-12-28 ENCOUNTER — Encounter: Payer: Self-pay | Admitting: Internal Medicine

## 2011-12-28 ENCOUNTER — Ambulatory Visit (INDEPENDENT_AMBULATORY_CARE_PROVIDER_SITE_OTHER): Payer: Medicare Other | Admitting: Internal Medicine

## 2011-12-28 ENCOUNTER — Encounter: Payer: Self-pay | Admitting: *Deleted

## 2011-12-28 VITALS — BP 112/60 | HR 60 | Temp 97.5°F | Resp 18 | Ht 69.0 in | Wt 172.0 lb

## 2011-12-28 DIAGNOSIS — R300 Dysuria: Secondary | ICD-10-CM

## 2011-12-28 DIAGNOSIS — Z7901 Long term (current) use of anticoagulants: Secondary | ICD-10-CM

## 2011-12-28 LAB — POCT URINALYSIS DIPSTICK
Glucose, UA: NEGATIVE
Nitrite, UA: POSITIVE
Spec Grav, UA: 1.01
Urobilinogen, UA: 0.2

## 2011-12-28 MED ORDER — SULFAMETHOXAZOLE-TRIMETHOPRIM 800-160 MG PO TABS: 1.0000 | ORAL_TABLET | Freq: Two times a day (BID) | ORAL | Status: AC

## 2011-12-28 NOTE — Telephone Encounter (Signed)
This encounter was created in error - please disregard.

## 2011-12-29 ENCOUNTER — Encounter: Payer: Self-pay | Admitting: Internal Medicine

## 2011-12-29 DIAGNOSIS — R300 Dysuria: Secondary | ICD-10-CM | POA: Insufficient documentation

## 2011-12-29 NOTE — Assessment & Plan Note (Signed)
Obtain ua and cx. Begin abx. Followup if no improvement or worsening.

## 2011-12-29 NOTE — Assessment & Plan Note (Signed)
Notify coumadin clinic of abx use.

## 2011-12-29 NOTE — Progress Notes (Signed)
  Subjective:    Patient ID: Geoffrey Romero, male    DOB: [birth year], 78 y.o.   MRN: [id number]  HPI Pt presents to clinic for evaluation of possible UTI. Notes one day h/o dysuria. No hematuria, n/v, fever or chills. No alleviating or exacerbating factors. Maintained on coumadin by coumadin clinic without gross active bleeding.   Past Medical History  Diagnosis Date  . Hypertension   . Hyperlipidemia   . History of diverticulitis of colon     history of diverticulosis  . PAF (paroxysmal atrial fibrillation)     on flecainide  . Long term current use of anticoagulant     anticoagulant therapy  . BPH (benign prostatic hyperplasia)   . Balanitis     phimosis, preputial adhesions  . CTS (carpal tunnel syndrome)   . History of sinus bradycardia     history of bradycardia on hig doses of Cardizem  . Left bundle branch block     normal Myoview   Past Surgical History  Procedure Date  . Knee arthroscopy 07/2006    left knee debridement, including partial medial and lateral menisectomy  . Lumbar laminectomy/decompression microdiscectomy 2004    L4-5  . Circumcision 2006  . Carpal tunnel release 2007/2008    reports that he has quit smoking. He has never used smokeless tobacco. He reports that he does not drink alcohol or use illicit drugs. family history includes Breast cancer in his sister; Diabetes in his mother; and Stroke in his mother. Allergies  Allergen Reactions  . Penicillins     REACTION: anaphylaxis     Review of Systems see hpi     Objective:   Physical Exam  Nursing note and vitals reviewed. Constitutional: He appears well-developed and well-nourished. No distress.  Abdominal: Soft. There is no tenderness.  Musculoskeletal:       No cva tenderness  Neurological: He is alert.  Skin: He is not diaphoretic.  Psychiatric: He has a normal mood and affect.          Assessment & Plan:

## 2011-12-30 LAB — URINE CULTURE: Colony Count: >=100000

## 2012-01-03 ENCOUNTER — Ambulatory Visit (INDEPENDENT_AMBULATORY_CARE_PROVIDER_SITE_OTHER): Payer: Medicare Other | Admitting: *Deleted

## 2012-01-03 DIAGNOSIS — I4891 Unspecified atrial fibrillation: Secondary | ICD-10-CM

## 2012-01-03 DIAGNOSIS — Z7901 Long term (current) use of anticoagulants: Secondary | ICD-10-CM

## 2012-01-05 ENCOUNTER — Telehealth: Payer: Self-pay | Admitting: Cardiology

## 2012-01-05 NOTE — Telephone Encounter (Signed)
New msg  Pt's wife called and said he passed out today about lunch time. She said he seems fine now. He was outside mowing before he came in. She wants to talk to you

## 2012-01-05 NOTE — Telephone Encounter (Signed)
Spoke with pt wife, the pt was out mowing the yard today and when he came inside for lunch he felt funny and then passed out. His wife states he was out for about 10 min. He was making a snoring sound per his wife and the pt states he wet his pants. He felt fine once he woke up and his bp and pulse were fine when he checked them after eating a little. He had no other symptoms prior to passing out except feeling funny. He denies chest pain or SOB. He has taken it easy the rest of the day and has had no further problems. He will see lori gerhardt np tomorrow at 9:15am. If he has any further problems tonight he will go to the ER for evaluation.

## 2012-01-06 ENCOUNTER — Ambulatory Visit (INDEPENDENT_AMBULATORY_CARE_PROVIDER_SITE_OTHER): Payer: Medicare Other | Admitting: Nurse Practitioner

## 2012-01-06 ENCOUNTER — Encounter (INDEPENDENT_AMBULATORY_CARE_PROVIDER_SITE_OTHER): Payer: Medicare Other

## 2012-01-06 ENCOUNTER — Encounter: Payer: Self-pay | Admitting: Nurse Practitioner

## 2012-01-06 ENCOUNTER — Ambulatory Visit (INDEPENDENT_AMBULATORY_CARE_PROVIDER_SITE_OTHER)
Admission: RE | Admit: 2012-01-06 | Discharge: 2012-01-06 | Disposition: A | Discharge status: Home / Self Care | Payer: Medicare Other | Source: Ambulatory Visit | Attending: Nurse Practitioner | Admitting: Nurse Practitioner

## 2012-01-06 VITALS — BP 146/59 | HR 57 | Ht 70.0 in | Wt 170.0 lb

## 2012-01-06 DIAGNOSIS — R55 Syncope and collapse: Secondary | ICD-10-CM

## 2012-01-06 DIAGNOSIS — I447 Left bundle-branch block, unspecified: Secondary | ICD-10-CM

## 2012-01-06 DIAGNOSIS — I4891 Unspecified atrial fibrillation: Secondary | ICD-10-CM

## 2012-01-06 DIAGNOSIS — I480 Paroxysmal atrial fibrillation: Secondary | ICD-10-CM

## 2012-01-06 LAB — CBC WITH DIFFERENTIAL/PLATELET
Basophils Absolute: 0 10*3/uL (ref 0.0–0.1)
Basophils Relative: 0.4 % (ref 0.0–3.0)
Eosinophils Absolute: 0.1 10*3/uL (ref 0.0–0.7)
Eosinophils Relative: 1.7 % (ref 0.0–5.0)
HCT: 41.8 % (ref 39.0–52.0)
Hemoglobin: 13.8 g/dL (ref 13.0–17.0)
Lymphocytes Relative: 17.1 % (ref 12.0–46.0)
Lymphs Abs: 1.3 10*3/uL (ref 0.7–4.0)
MCHC: 33.1 g/dL (ref 30.0–36.0)
MCV: 93.9 fl (ref 78.0–100.0)
Monocytes Absolute: 0.9 10*3/uL (ref 0.1–1.0)
Monocytes Relative: 11.2 % (ref 3.0–12.0)
Neutro Abs: 5.3 10*3/uL (ref 1.4–7.7)
Neutrophils Relative %: 69.6 % (ref 43.0–77.0)
Platelets: 184 10*3/uL (ref 150.0–400.0)
RBC: 4.45 Mil/uL (ref 4.22–5.81)
RDW: 14.1 % (ref 11.5–14.6)
WBC: 7.7 10*3/uL (ref 4.5–10.5)

## 2012-01-06 LAB — TROPONIN I: Troponin I: <0.3 ng/mL (ref <0.30)

## 2012-01-06 LAB — BASIC METABOLIC PANEL
BUN: 21 mg/dL (ref 6–23)
CO2: 26 mEq/L (ref 19–32)
Calcium: 9.2 mg/dL (ref 8.4–10.5)
Chloride: 102 mEq/L (ref 96–112)
Creatinine, Ser: 1.1 mg/dL (ref 0.4–1.5)
GFR: 66.15 mL/min (ref >60.00)
Glucose, Bld: 86 mg/dL (ref 70–99)
Potassium: 4.8 mEq/L (ref 3.5–5.1)
Sodium: 135 mEq/L (ref 135–145)

## 2012-01-06 LAB — TSH: TSH: 4.23 u[IU]/mL (ref 0.35–5.50)

## 2012-01-06 NOTE — Patient Instructions (Addendum)
We are going to check labs today  We are going to put a heart monitor on for the next month to watch your rhythm  We are going to check a scan on your head today to make sure there is no stroke or bleeding in your head  Stay well hydrated and avoid the heat  You should not be driving  We are stopping your Flecainide as of today  Dr. Jens Som will see you in a month  Call the [facility] office at [phone] if you have any questions, problems or concerns.

## 2012-01-06 NOTE — Progress Notes (Signed)
Dierdre Searles Date of Birth: 17-Apr-1924 Medical Record #[id number]  History of Present Illness: Geoffrey Romero is seen today for a work in visit. He is seen for Dr. Jens Som. He is 78 years old. He has PAF and known preserved LV function. Last echo was in January of this year. Last seen in early May and felt to be doing ok. Has had a recent normal Lexiscan as well. He is on Flecainide therapy for his PAF since March. His other problems include BPH, HTN, HLD, and bradycardia.   He comes in today. He is here alone. No one came with him today.  His wife called late in the day yesterday. He had taken his son to an appointment and returned home at 10:30 am. He noticed that the weather was looking like it might rain and he went out to mow around 11am. He had been out mowing for about an hour or so. Did not take any extra water like he usually does. When he came in for lunch, he felt "funny" coming up the steps, but was able to get to the table. He then passed out. His wife had told Geoffrey Romero, he passed out while coming in the door. Wife says he was out for about 10 minutes. Was making snoring sounds and was incontinent of urine. When he woke up, he was fine. Had chipped his left front tooth.  Blood pressure and pulse were ok when checked after eating a little. He did not have any chest pain or SOB. He feels fine now. He continues to drive. Did note a little headache and still has some today. He is on coumadin. No vision changes. No nausea reported.   Current Outpatient Prescriptions on File Prior to Visit  Medication Sig Dispense Refill  . Ascorbic Acid (VITAMIN C) 500 MG tablet Take 500 mg by mouth daily.        . B Complex Vitamins (B COMPLEX 1) tablet Take 1 tablet by mouth daily.        . Cholecalciferol (VITAMIN D3) 1000 UNITS tablet Take 1,000 Units by mouth daily.        . clemastine (TAVIST) 2.68 MG TABS Take 2.68 mg by mouth as needed.       . finasteride (PROSCAR) 5 MG tablet Take 1 tablet (5 mg  total) by mouth daily.  30 tablet  3  . flecainide (TAMBOCOR) 100 MG tablet Take 1 tablet (100 mg total) by mouth 2 (two) times daily.  60 tablet  12  . lisinopril (PRINIVIL,ZESTRIL) 5 MG tablet Take 2.5 mg by mouth daily.        . Multiple Vitamins-Minerals (OCUVITE PO) Take by mouth daily.        . Omega-3 Fatty Acids (FISH OIL) 1000 MG CAPS Take 1,000 capsules by mouth daily.        Marland Kitchen sulfamethoxazole-trimethoprim (BACTRIM DS,SEPTRA DS) 800-160 MG per tablet Take 1 tablet by mouth 2 (two) times daily.  14 tablet  0  . triamcinolone cream (KENALOG) 0.1 % Apply 1 application topically 4 (four) times daily.      Marland Kitchen warfarin (COUMADIN) 5 MG tablet Take as directed by anticoagulation clinic  135 tablet  1  . DISCONTD: diltiazem (CARDIZEM) 30 MG tablet Take 30 mg by mouth 3 (three) times daily.         Allergies  Allergen Reactions  . Penicillins     REACTION: anaphylaxis    Past Medical History  Diagnosis Date  . Hypertension   .  Hyperlipidemia   . History of diverticulitis of colon     history of diverticulosis  . PAF (paroxysmal atrial fibrillation)     on flecainide  . Long term current use of anticoagulant     anticoagulant therapy  . BPH (benign prostatic hyperplasia)   . Balanitis     phimosis, preputial adhesions  . CTS (carpal tunnel syndrome)   . History of sinus bradycardia     history of bradycardia on hig doses of Cardizem  . Left bundle branch block     normal Myoview    Past Surgical History  Procedure Date  . Knee arthroscopy 07/2006    left knee debridement, including partial medial and lateral menisectomy  . Lumbar laminectomy/decompression microdiscectomy 2004    L4-5  . Circumcision 2006  . Carpal tunnel release 2007/2008    History  Smoking status  . Former Smoker  Smokeless tobacco  . Never Used    History  Alcohol Use No    Family History  Problem Relation Age of Onset  . Breast cancer Sister   . Stroke Mother   . Diabetes Mother      Review of Systems: The review of systems is per the HPI.  All other systems were reviewed and are negative.  Physical Exam: BP 146/65  Pulse 57  Ht 5\' 10"  (1.778 m)  Wt 170 lb (77.111 kg)  BMI 24.39 kg/m2 Patient is very pleasant and in no acute distress. Skin is warm and dry. Color is normal.  HEENT is unremarkable. Normocephalic/atraumatic. PERRL. Sclera are nonicteric. Neck is supple. No masses. No JVD. Lungs are clear. Cardiac exam shows a regular rate and rhythm. Abdomen is soft. Extremities are without edema. Gait and ROM are intact. No gross neurologic deficits noted.   LABORATORY DATA: EKG today shows sinus brady with LBBB. This tracing is reviewed with Dr. Graciela Husbands.   Lab Results  Component Value Date   INR 2.0 01/03/2012   INR 2.1 12/27/2011   INR 1.8 11/25/2011   PROTIME 16.2 12/25/2008      Lab Results  Component Value Date   WBC 6.8 08/16/2011   HGB 14.2 08/16/2011   HCT 41.8 08/16/2011   PLT 169.0 08/16/2011   GLUCOSE 90 09/30/2010   CHOL 169 09/30/2010   TRIG 66 09/30/2010   HDL 38* 09/30/2010   LDLCALC 118* 09/30/2010   ALT 18 09/30/2010   AST 23 09/30/2010   NA 143 09/30/2010   K 4.8 09/30/2010   CL 106 09/30/2010   CREATININE 0.88 09/30/2010   BUN 23 09/30/2010   CO2 25 09/30/2010   TSH 3.56 08/16/2011   PSA 3.05 11/13/2007   INR 2.0 01/03/2012     Echo Study Conclusions From January 2013  - Left ventricle: The cavity size was normal. Wall thickness was increased in a pattern of mild LVH. Systolic function was normal. The estimated ejection fraction was in the range of 60% to 65%. Wall motion was normal; there were no regional wall motion abnormalities. Features are consistent with a pseudonormal left ventricular filling pattern, with concomitant abnormal relaxation and increased filling pressure (grade 2 diastolic dysfunction). - Aortic valve: There was no stenosis. - Mitral valve: Mild regurgitation. - Left atrium: The atrium was mildly dilated. - Right ventricle: The  cavity size was normal. Systolic function was normal. - Pulmonic valve: Mild to moderate regurgitation. - Inferior vena cava: The vessel was normal in size; the respirophasic diameter changes were in the normal range (= 50%);  findings are consistent with normal central venous pressure.  Myoview Impression from March 2013 Exercise Capacity: Lexiscan with no exercise.  BP Response: Normal blood pressure response.  Clinical Symptoms: Sense of fullness  ECG Impression: LBBB, no change with infusion.  Comparison with Prior Nuclear Study: No significant change from previous study   Overall Impression: Normal stress nuclear study.   Assessment / Plan:

## 2012-01-06 NOTE — Assessment & Plan Note (Addendum)
Patient presents today following an episode of syncope that occurred yesterday. Perhaps he got dehydrated. However, he has a L BBB, PAF and bradycardia. His QRS appears wider. He is also on Flecainide which he says has done a nice job of controlling his PAF. He has had recent echo and Myoview from earlier this year. I have discussed his case with Dr. Graciela Husbands who was here in the office today. It his feeling that this is more related to the L BBB and perhaps was arrhythmic in origin. We are stopping the Flecainide as of now. We will check complete labs today, arrange for a CT of the head, and place an event monitor for one month. He was advised to not drive but this will not be possible. His wife no longer drives. He needs to stay hydrated regardless. Will need to see him back in follow up. If he has recurrence, he is to go the ER. Patient is agreeable to this plan and will call if any problems develop in the interim.

## 2012-01-07 LAB — FLECAINIDE LEVEL

## 2012-01-10 ENCOUNTER — Telehealth: Payer: Self-pay | Admitting: Internal Medicine

## 2012-01-10 NOTE — Telephone Encounter (Signed)
Will not interfere at all

## 2012-01-10 NOTE — Telephone Encounter (Signed)
Call returned to patient's wife Scarlette Calico at [phone], she was informed per Dr Rodena Medin instructions.

## 2012-01-10 NOTE — Telephone Encounter (Signed)
Patients wife frances states that patient passed out last week. He went to see Dr. Jens Som and is now on a heart monitor. Patient has an upcoming medicare wellness appointment and wanted to know if the heart monitor would interfere with his physical??

## 2012-01-25 ENCOUNTER — Ambulatory Visit: Payer: Medicare Other | Admitting: Internal Medicine

## 2012-01-31 ENCOUNTER — Telehealth: Payer: Self-pay | Admitting: *Deleted

## 2012-01-31 ENCOUNTER — Ambulatory Visit (INDEPENDENT_AMBULATORY_CARE_PROVIDER_SITE_OTHER): Payer: Medicare Other

## 2012-01-31 DIAGNOSIS — I4891 Unspecified atrial fibrillation: Secondary | ICD-10-CM

## 2012-01-31 DIAGNOSIS — Z7901 Long term (current) use of anticoagulants: Secondary | ICD-10-CM

## 2012-01-31 NOTE — Telephone Encounter (Signed)
Left message for pt to call, monitor reviewed by dr Jens Som shows sinus brady with PAC's

## 2012-02-03 NOTE — Telephone Encounter (Signed)
Spoke with pt wife, aware of results. 

## 2012-02-15 ENCOUNTER — Encounter: Payer: Self-pay | Admitting: Cardiology

## 2012-02-15 ENCOUNTER — Ambulatory Visit (INDEPENDENT_AMBULATORY_CARE_PROVIDER_SITE_OTHER): Payer: Medicare Other | Admitting: Cardiology

## 2012-02-15 VITALS — BP 150/69 | HR 59 | Wt 167.0 lb

## 2012-02-15 DIAGNOSIS — R55 Syncope and collapse: Secondary | ICD-10-CM

## 2012-02-15 DIAGNOSIS — I10 Essential (primary) hypertension: Secondary | ICD-10-CM

## 2012-02-15 DIAGNOSIS — I4891 Unspecified atrial fibrillation: Secondary | ICD-10-CM

## 2012-02-15 DIAGNOSIS — E785 Hyperlipidemia, unspecified: Secondary | ICD-10-CM

## 2012-02-15 NOTE — Progress Notes (Signed)
HPI: Geoffrey Romero is a very pleasant gentleman who has a history of paroxysmal atrial fibrillation and preserved LV function. We repeated an echocardiogram in Jan 2013 that showed normal LV function, grade 2 diastolic dysfunction, mild mitral regurgitation, mild to moderate PI and mild left atrial enlargement. FU monitor in Jan 2013 showed PAF. TSH normal. Lexiscan Myoview in March of 2013 showed an ejection fraction of 54% and normal perfusion. Patient seen in June of 2013 for syncope. Patient's flecainide was discontinued. Head CT showed no skull fracture. There was a right frontal extra-axial process suggesting a vessel that a subdural hematoma could not be excluded. Event monitor showed sinus rhythm with PACs. It was felt his syncopal episode may have been related to dehydration. Since that time, the patient denies any dyspnea on exertion, orthopnea, PND, pedal edema, palpitations, syncope or chest pain. No bleeding. No headache.    Current Outpatient Prescriptions  Medication Sig Dispense Refill  . Ascorbic Acid (VITAMIN C) 500 MG tablet Take 500 mg by mouth daily.        . B Complex Vitamins (B COMPLEX 1) tablet Take 1 tablet by mouth daily.        . Cholecalciferol (VITAMIN D3) 1000 UNITS tablet Take 1,000 Units by mouth daily.        . clemastine (TAVIST) 2.68 MG TABS Take 2.68 mg by mouth as needed.       . finasteride (PROSCAR) 5 MG tablet Take 1 tablet (5 mg total) by mouth daily.  30 tablet  3  . lisinopril (PRINIVIL,ZESTRIL) 10 MG tablet Take 5 mg by mouth daily.      . Multiple Vitamins-Minerals (OCUVITE PO) Take by mouth daily.        . Omega-3 Fatty Acids (FISH OIL) 1000 MG CAPS Take 1,000 capsules by mouth daily.        Marland Kitchen triamcinolone cream (KENALOG) 0.1 % Apply 1 application topically 4 (four) times daily.      Marland Kitchen warfarin (COUMADIN) 5 MG tablet Take as directed by anticoagulation clinic  135 tablet  1  . DISCONTD: diltiazem (CARDIZEM) 30 MG tablet Take 30 mg by mouth 3 (three)  times daily.          Past Medical History  Diagnosis Date  . Hypertension   . Hyperlipidemia   . History of diverticulitis of colon     history of diverticulosis  . PAF (paroxysmal atrial fibrillation)     on flecainide  . Long term current use of anticoagulant     anticoagulant therapy  . BPH (benign prostatic hyperplasia)   . Balanitis     phimosis, preputial adhesions  . CTS (carpal tunnel syndrome)   . History of sinus bradycardia     history of bradycardia on hig doses of Cardizem  . Left bundle branch block     normal Myoview    Past Surgical History  Procedure Date  . Knee arthroscopy 07/2006    left knee debridement, including partial medial and lateral menisectomy  . Lumbar laminectomy/decompression microdiscectomy 2004    L4-5  . Circumcision 2006  . Carpal tunnel release 2007/2008    History   Social History  . Marital Status: Married    Spouse Name: N/A    Number of Children: N/A  . Years of Education: N/A   Occupational History  . Not on file.   Social History Main Topics  . Smoking status: Former Games developer  . Smokeless tobacco: Never Used  . Alcohol Use:  No  . Drug Use: No  . Sexually Active: No   Other Topics Concern  . 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 Remaining systems are negative.  Physical Exam: Well-developed well-nourished in no acute distress.  Skin is warm and dry.  HEENT is normal.  Neck is supple.  Chest is clear to auscultation with normal expansion.  Cardiovascular exam is regular rate and rhythm.  Abdominal exam nontender or distended. No masses palpated. Extremities show no edema. neuro grossly intact  ECG sinus rhythm at a rate of 59. Possible limb lead  reversal. Prior septal infarct.

## 2012-02-15 NOTE — Assessment & Plan Note (Signed)
Blood pressure controlled. Continue present medications. 

## 2012-02-15 NOTE — Assessment & Plan Note (Signed)
Management per primary care. 

## 2012-02-15 NOTE — Assessment & Plan Note (Addendum)
Patient remains in sinus rhythm. We will continue off of flecainide given previous syncopal episode. If he has frequent episodes in the future we will consider a different antiarrhythmic. Continue Coumadin. Note he has not had headaches suggesting subdural.

## 2012-02-15 NOTE — Assessment & Plan Note (Signed)
No recurrent episodes. Will continue off flecainide although not clear that this caused his event. Previous monitor negative. Patient instructed not to drive for at least 3 months following previous episode.

## 2012-02-15 NOTE — Patient Instructions (Addendum)
Your physician recommends that you schedule a follow-up appointment in 3 MONTHS. 

## 2012-03-06 ENCOUNTER — Ambulatory Visit (INDEPENDENT_AMBULATORY_CARE_PROVIDER_SITE_OTHER): Payer: Medicare Other

## 2012-03-06 DIAGNOSIS — I4891 Unspecified atrial fibrillation: Secondary | ICD-10-CM

## 2012-03-06 DIAGNOSIS — Z7901 Long term (current) use of anticoagulants: Secondary | ICD-10-CM

## 2012-03-06 LAB — POCT INR: INR: 2.3

## 2012-04-17 ENCOUNTER — Ambulatory Visit (INDEPENDENT_AMBULATORY_CARE_PROVIDER_SITE_OTHER): Payer: Medicare Other | Admitting: *Deleted

## 2012-04-17 DIAGNOSIS — I4891 Unspecified atrial fibrillation: Secondary | ICD-10-CM

## 2012-04-17 DIAGNOSIS — Z7901 Long term (current) use of anticoagulants: Secondary | ICD-10-CM

## 2012-04-17 LAB — POCT INR: INR: 2.2

## 2012-05-23 ENCOUNTER — Ambulatory Visit (INDEPENDENT_AMBULATORY_CARE_PROVIDER_SITE_OTHER): Payer: Medicare Other | Admitting: *Deleted

## 2012-05-23 ENCOUNTER — Encounter: Payer: Self-pay | Admitting: Cardiology

## 2012-05-23 ENCOUNTER — Ambulatory Visit (INDEPENDENT_AMBULATORY_CARE_PROVIDER_SITE_OTHER): Payer: Medicare Other | Admitting: Cardiology

## 2012-05-23 VITALS — BP 148/68 | HR 65 | Wt 169.0 lb

## 2012-05-23 DIAGNOSIS — E785 Hyperlipidemia, unspecified: Secondary | ICD-10-CM

## 2012-05-23 DIAGNOSIS — R55 Syncope and collapse: Secondary | ICD-10-CM

## 2012-05-23 DIAGNOSIS — Z7901 Long term (current) use of anticoagulants: Secondary | ICD-10-CM

## 2012-05-23 DIAGNOSIS — I4891 Unspecified atrial fibrillation: Secondary | ICD-10-CM

## 2012-05-23 DIAGNOSIS — I10 Essential (primary) hypertension: Secondary | ICD-10-CM

## 2012-05-23 NOTE — Progress Notes (Signed)
HPI: Geoffrey Romero is a very pleasant gentleman who has a history of paroxysmal atrial fibrillation and preserved LV function. We repeated an echocardiogram in Jan 2013 that showed normal LV function, grade 2 diastolic dysfunction, mild mitral regurgitation, mild to moderate PI and mild left atrial enlargement. FU monitor in Jan 2013 showed PAF. TSH normal. Lexiscan Myoview in March of 2013 showed an ejection fraction of 54% and normal perfusion. Patient seen in June of 2013 for syncope. Patient's flecainide was discontinued. Head CT showed no skull fracture. There was a right frontal extra-axial process suggesting a vessel rather than a subdural hematoma. Event monitor showed sinus rhythm with PACs. It was felt his syncopal episode may have been related to dehydration. I last saw him in July of 2013. Since then, the patient denies any dyspnea on exertion, orthopnea, PND, pedal edema, palpitations, syncope or chest pain.    Current Outpatient Prescriptions  Medication Sig Dispense Refill  . Ascorbic Acid (VITAMIN C) 500 MG tablet Take 500 mg by mouth daily.        . B Complex Vitamins (B COMPLEX 1) tablet Take 1 tablet by mouth daily.        . Cholecalciferol (VITAMIN D3) 1000 UNITS tablet Take 1,000 Units by mouth daily.        . clemastine (TAVIST) 2.68 MG TABS Take 2.68 mg by mouth as needed.       . finasteride (PROSCAR) 5 MG tablet Take 1 tablet (5 mg total) by mouth daily.  30 tablet  3  . lisinopril (PRINIVIL,ZESTRIL) 10 MG tablet Take 5 mg by mouth daily.      . Multiple Vitamins-Minerals (OCUVITE PO) Take by mouth daily.        . Omega-3 Fatty Acids (FISH OIL) 1000 MG CAPS Take 1,000 capsules by mouth daily.        Marland Kitchen triamcinolone cream (KENALOG) 0.1 % Apply 1 application topically 4 (four) times daily.      Marland Kitchen warfarin (COUMADIN) 5 MG tablet Take as directed by anticoagulation clinic  135 tablet  1  . DISCONTD: diltiazem (CARDIZEM) 30 MG tablet Take 30 mg by mouth 3 (three) times daily.           Past Medical History  Diagnosis Date  . Hypertension   . Hyperlipidemia   . History of diverticulitis of colon     history of diverticulosis  . PAF (paroxysmal atrial fibrillation)     on flecainide  . Long term current use of anticoagulant     anticoagulant therapy  . BPH (benign prostatic hyperplasia)   . Balanitis     phimosis, preputial adhesions  . CTS (carpal tunnel syndrome)   . History of sinus bradycardia     history of bradycardia on hig doses of Cardizem  . Left bundle branch block     normal Myoview    Past Surgical History  Procedure Date  . Knee arthroscopy 07/2006    left knee debridement, including partial medial and lateral menisectomy  . Lumbar laminectomy/decompression microdiscectomy 2004    L4-5  . Circumcision 2006  . Carpal tunnel release 2007/2008    History   Social History  . Marital Status: Married    Spouse Name: N/A    Number of Children: N/A  . Years of Education: N/A   Occupational History  . Not on file.   Social History Main Topics  . Smoking status: Former Games developer  . Smokeless tobacco: Never Used  . Alcohol Use:  No  . Drug Use: No  . Sexually Active: No   Other Topics Concern  . 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 Remaining systems are negative.  Physical Exam: Well-developed well-nourished in no acute distress.  Skin is warm and dry.  HEENT is normal.  Neck is supple.  Chest is clear to auscultation with normal expansion.  Cardiovascular exam is regular rate and rhythm.  Abdominal exam nontender or distended. No masses palpated. Extremities show no edema. neuro grossly intact  ECG sinus rhythm at a rate of 65. Left axis deviation. No significant ST  changes. Cannot rule out prior septal infarct.

## 2012-05-23 NOTE — Assessment & Plan Note (Signed)
Blood pressure controlled. Continue present medications. 

## 2012-05-23 NOTE — Assessment & Plan Note (Signed)
No recurrent episodes. 

## 2012-05-23 NOTE — Patient Instructions (Addendum)
Your physician recommends that you continue on your current medications as directed. Please refer to the Current Medication list given to you today.  Your physician wants you to follow-up in: 6 months. You will receive a reminder letter in the mail two months in advance. If you don't receive a letter, please call our office to schedule the follow-up appointment.  

## 2012-05-23 NOTE — Assessment & Plan Note (Signed)
Patient remains in sinus rhythm. We will avoid flecainide given history of syncope while on that medication. Note his event may have been related to dehydration. If he has more frequent episodes in the future we'll consider a different antiarrhythmic. Continue Coumadin.

## 2012-05-23 NOTE — Assessment & Plan Note (Signed)
Management per primary care. 

## 2012-06-14 ENCOUNTER — Encounter: Payer: Self-pay | Admitting: Internal Medicine

## 2012-06-14 ENCOUNTER — Ambulatory Visit (INDEPENDENT_AMBULATORY_CARE_PROVIDER_SITE_OTHER): Payer: Medicare Other | Admitting: Internal Medicine

## 2012-06-14 VITALS — BP 116/68 | HR 59 | Temp 97.6°F | Resp 16 | Wt 168.0 lb

## 2012-06-14 DIAGNOSIS — J310 Chronic rhinitis: Secondary | ICD-10-CM | POA: Insufficient documentation

## 2012-06-14 NOTE — Assessment & Plan Note (Signed)
Can continue over-the-counter antihistamine as needed. Avoid decongestants. Given sample of Nasonex 2 sprays each nostril each bedtime. Demonstrated use. Followup if no improvement or worsening.

## 2012-06-14 NOTE — Progress Notes (Signed)
  Subjective:    Patient ID: Geoffrey Romero, male    DOB: 1924/01/29, 78 y.o.   MRN: [id number]  HPI patient presents to clinic for evaluation of sinus pain and pressure. Notes four-day history of sinus congestion nasal congestion sinus pressure and bilateral ear fullness. No fever or chills and does not feel he is sick. States symptoms occurred after working in yard with leaves. Take an antihistamine. Has received influenza vaccine for the season already.  Past Medical History  Diagnosis Date  . Hypertension   . Hyperlipidemia   . History of diverticulitis of colon     history of diverticulosis  . PAF (paroxysmal atrial fibrillation)     on flecainide  . Long term current use of anticoagulant     anticoagulant therapy  . BPH (benign prostatic hyperplasia)   . Balanitis     phimosis, preputial adhesions  . CTS (carpal tunnel syndrome)   . History of sinus bradycardia     history of bradycardia on hig doses of Cardizem  . Left bundle branch block     normal Myoview   Past Surgical History  Procedure Date  . Knee arthroscopy 07/2006    left knee debridement, including partial medial and lateral menisectomy  . Lumbar laminectomy/decompression microdiscectomy 2004    L4-5  . Circumcision 2006  . Carpal tunnel release 2007/2008    reports that he has quit smoking. He has never used smokeless tobacco. He reports that he does not drink alcohol or use illicit drugs. family history includes Breast cancer in his sister; Diabetes in his mother; and Stroke in his mother. Allergies  Allergen Reactions  . Tambocor (Flecainide) Other (See Comments)    Mental Issues.  . Penicillins     REACTION: anaphylaxis     Review of Systems see history of present illness     Objective:   Physical Exam  Nursing note and vitals reviewed. Constitutional: He appears well-developed and well-nourished. No distress.  HENT:  Head: Normocephalic and atraumatic.  Right Ear: Tympanic membrane, external  ear and ear canal normal.  Left Ear: External ear and ear canal normal. A middle ear effusion is present.  Nose: Nose normal.  Mouth/Throat: Oropharynx is clear and moist. No oropharyngeal exudate.  Eyes: Conjunctivae normal are normal. No scleral icterus.  Neck: Neck supple.  Skin: He is not diaphoretic.          Assessment & Plan:

## 2012-06-15 ENCOUNTER — Other Ambulatory Visit: Payer: Self-pay | Admitting: Cardiology

## 2012-06-19 ENCOUNTER — Telehealth: Payer: Self-pay | Admitting: *Deleted

## 2012-06-19 MED ORDER — MOMETASONE FUROATE 50 MCG/ACT NA SUSP: 2.0000 | Freq: Every day | NASAL | Status: DC

## 2012-06-19 NOTE — Telephone Encounter (Signed)
Too soon to tell if helpful. Can call in flonase 2 sprays each nostril qhs #1 rf6

## 2012-06-19 NOTE — Telephone Encounter (Signed)
Pt was in office 11.20.13 for Rhinitis, given Nasonex sample to be used: 2 sprays each nostril QHS; pt's spouse reports pt is almost out of sample bottle [reiterated usage instructions] & that he is still having sinus blockage & ear fullness causing loss of hearing now in both ears/SLS Please advise.

## 2012-06-19 NOTE — Telephone Encounter (Signed)
Pt's spouse informed, understood & agreed to refill; Rx to pharmacy/SLS

## 2012-07-04 ENCOUNTER — Ambulatory Visit (INDEPENDENT_AMBULATORY_CARE_PROVIDER_SITE_OTHER): Payer: Medicare Other | Admitting: *Deleted

## 2012-07-04 DIAGNOSIS — I4891 Unspecified atrial fibrillation: Secondary | ICD-10-CM

## 2012-07-04 DIAGNOSIS — Z7901 Long term (current) use of anticoagulants: Secondary | ICD-10-CM

## 2012-07-31 ENCOUNTER — Ambulatory Visit (INDEPENDENT_AMBULATORY_CARE_PROVIDER_SITE_OTHER): Payer: Medicare Other | Admitting: *Deleted

## 2012-07-31 DIAGNOSIS — I4891 Unspecified atrial fibrillation: Secondary | ICD-10-CM

## 2012-07-31 DIAGNOSIS — Z7901 Long term (current) use of anticoagulants: Secondary | ICD-10-CM

## 2012-08-08 ENCOUNTER — Ambulatory Visit (INDEPENDENT_AMBULATORY_CARE_PROVIDER_SITE_OTHER): Payer: Medicare Other | Admitting: Family

## 2012-08-08 ENCOUNTER — Encounter: Payer: Self-pay | Admitting: Family

## 2012-08-08 VITALS — BP 118/60 | HR 60 | Temp 97.7°F | Resp 16 | Wt 168.0 lb

## 2012-08-08 DIAGNOSIS — T148XXA Other injury of unspecified body region, initial encounter: Secondary | ICD-10-CM | POA: Insufficient documentation

## 2012-08-08 DIAGNOSIS — T1490XA Injury, unspecified, initial encounter: Secondary | ICD-10-CM

## 2012-08-08 DIAGNOSIS — J3489 Other specified disorders of nose and nasal sinuses: Secondary | ICD-10-CM

## 2012-08-08 NOTE — Assessment & Plan Note (Signed)
Area on right calf appears to be due to trauma an should heal on its own.  I have asked the wife to keep an eye on this and call me if the area is not resolved in 2 weeks. She verbalizes understanding.

## 2012-08-08 NOTE — Progress Notes (Signed)
Subjective:    Patient ID: Geoffrey Romero, male    DOB: 1924-05-01, 79 y.o.   MRN: [id number]  HPI  [name] is an 79 yr old male who presents with 2 concerns.  1) Nasal dryness- notes occasional "bloody nose at night," and nasal mucosa feels dry.  2) Skin problem- notes red area on right posterior thigh. Non- tender   Review of Systems    see HPI  Past Medical History  Diagnosis Date  . Hypertension   . Hyperlipidemia   . History of diverticulitis of colon     history of diverticulosis  . PAF (paroxysmal atrial fibrillation)     on flecainide  . Long term current use of anticoagulant     anticoagulant therapy  . BPH (benign prostatic hyperplasia)   . Balanitis     phimosis, preputial adhesions  . CTS (carpal tunnel syndrome)   . History of sinus bradycardia     history of bradycardia on hig doses of Cardizem  . Left bundle branch block     normal Myoview    History   Social History  . Marital Status: Married    Spouse Name: N/A    Number of Children: N/A  . Years of Education: N/A   Occupational History  . Not on file.   Social History Main Topics  . Smoking status: Former Games developer  . Smokeless tobacco: Never Used  . Alcohol Use: No  . Drug Use: No  . Sexually Active: No   Other Topics Concern  . 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 Knee arthroscopy 07/2006    left knee debridement, including partial medial and lateral menisectomy  . Lumbar laminectomy/decompression microdiscectomy 2004    L4-5  . Circumcision 2006  . Carpal tunnel release 2007/2008    Family History  Problem Relation Age of Onset  . Breast cancer Sister   . Stroke Mother   . Diabetes Mother     Allergies  Allergen Reactions  . Tambocor (Flecainide) Other (See Comments)    Mental Issues.  . Penicillins     REACTION: anaphylaxis    Current Outpatient  Prescriptions on File Prior to Visit  Medication Sig Dispense Refill  . Ascorbic Acid (VITAMIN C) 500 MG tablet Take 500 mg by mouth daily.        . B Complex Vitamins (B COMPLEX 1) tablet Take 1 tablet by mouth daily.        . Cholecalciferol (VITAMIN D3) 1000 UNITS tablet Take 1,000 Units by mouth daily.        . clemastine (TAVIST) 2.68 MG TABS Take 2.68 mg by mouth as needed.       . finasteride (PROSCAR) 5 MG tablet Take 1 tablet (5 mg total) by mouth daily.  30 tablet  3  . lisinopril (PRINIVIL,ZESTRIL) 10 MG tablet Take 5 mg by mouth daily.      . mometasone (NASONEX) 50 MCG/ACT nasal spray Place 2 sprays into the nose at bedtime.  17 g  6  . Multiple Vitamins-Minerals (OCUVITE PO) Take by mouth daily.        . Omega-3 Fatty Acids (FISH OIL) 1000 MG CAPS Take 1,000 capsules by mouth daily.        Marland Kitchen triamcinolone cream (KENALOG) 0.1 % Apply 1 application topically 4 (four) times daily.      Marland Kitchen  warfarin (COUMADIN) 5 MG tablet TAKE AS DIRECTED BY ANTICOAGULATION     CLINIC  135 tablet  1  . [DISCONTINUED] diltiazem (CARDIZEM) 30 MG tablet Take 30 mg by mouth 3 (three) times daily.         BP 118/60  Pulse 60  Temp 97.7 F (36.5 C) (Oral)  Resp 16  Wt 168 lb (76.204 kg)  SpO2 99%    Objective:   Physical Exam  Constitutional: He appears well-developed and well-nourished. No distress.  Cardiovascular: Normal rate and regular rhythm.   No murmur heard. Pulmonary/Chest: Effort normal and breath sounds normal. No respiratory distress. He has no wheezes. He has no rales. He exhibits no tenderness.  Musculoskeletal: He exhibits no edema.  Skin: Skin is warm and dry.       Quarter sized lesion right calf with some surrounding excoriation.  Area is red but no discharge.          Assessment & Plan:

## 2012-08-08 NOTE — Patient Instructions (Addendum)
Run humidifier at night in your bedroom. You can use nasal saline spray to both nostrils once daily. Monitor the area of irritation on your leg- this appears to be due to trauma and should heal over the next week or two. Call us if the area is not completely healed in 2 weeks.  Follow up in 3 months.

## 2012-08-08 NOTE — Assessment & Plan Note (Signed)
Recommended that he use a humidifier in his bedroom. Also use nasal saline spray bid.

## 2012-08-30 ENCOUNTER — Encounter: Payer: Self-pay | Admitting: Gastroenterology

## 2012-09-04 ENCOUNTER — Ambulatory Visit (INDEPENDENT_AMBULATORY_CARE_PROVIDER_SITE_OTHER): Payer: Medicare Other | Admitting: Internal Medicine

## 2012-09-04 ENCOUNTER — Encounter: Payer: Self-pay | Admitting: Internal Medicine

## 2012-09-04 VITALS — BP 120/60 | HR 64 | Temp 97.6°F | Ht 70.0 in | Wt 168.0 lb

## 2012-09-04 DIAGNOSIS — I4891 Unspecified atrial fibrillation: Secondary | ICD-10-CM

## 2012-09-04 DIAGNOSIS — E785 Hyperlipidemia, unspecified: Secondary | ICD-10-CM

## 2012-09-04 DIAGNOSIS — I10 Essential (primary) hypertension: Secondary | ICD-10-CM

## 2012-09-04 LAB — CBC WITH DIFFERENTIAL/PLATELET
Basophils Relative: 0.5 % (ref 0.0–3.0)
Eosinophils Relative: 1.6 % (ref 0.0–5.0)
HCT: 40.7 % (ref 39.0–52.0)
Hemoglobin: 13.6 g/dL (ref 13.0–17.0)
Lymphs Abs: 1.3 10*3/uL (ref 0.7–4.0)
MCV: 92.5 fl (ref 78.0–100.0)
Monocytes Absolute: 0.7 10*3/uL (ref 0.1–1.0)
Neutro Abs: 5.6 10*3/uL (ref 1.4–7.7)
Platelets: 173 10*3/uL (ref 150.0–400.0)
WBC: 7.8 10*3/uL (ref 4.5–10.5)

## 2012-09-04 LAB — LIPID PANEL
Cholesterol: 176 mg/dL (ref 0–200)
HDL: 29.4 mg/dL — ABNORMAL LOW (ref >39.00)
Triglycerides: 147 mg/dL (ref 0.0–149.0)

## 2012-09-04 LAB — HEPATIC FUNCTION PANEL
ALT: 20 U/L (ref 0–53)
Albumin: 4.2 g/dL (ref 3.5–5.2)
Total Protein: 6.9 g/dL (ref 6.0–8.3)

## 2012-09-04 LAB — BASIC METABOLIC PANEL
BUN: 22 mg/dL (ref 6–23)
CO2: 28 mEq/L (ref 19–32)
Calcium: 9.4 mg/dL (ref 8.4–10.5)
Creatinine, Ser: 1.1 mg/dL (ref 0.4–1.5)
GFR: 66.74 mL/min (ref >60.00)
Glucose, Bld: 99 mg/dL (ref 70–99)
Sodium: 137 mEq/L (ref 135–145)

## 2012-09-04 NOTE — Assessment & Plan Note (Signed)
Well controlled.  Monitor electrolytes and kidney function  BP: 120/60 mmHg

## 2012-09-04 NOTE — Assessment & Plan Note (Signed)
Patient remains in sinus rhythm. He denies any signs of abnormal bleeding on Coumadin. Monitor CBCD.

## 2012-09-04 NOTE — Progress Notes (Signed)
Subjective:    Patient ID: Geoffrey Romero, male    DOB: 02-Jun-1924, 79 y.o.   MRN: [id number]  HPI  79 year old white male with hypertension, hyperlipidemia and paroxysmal atrial fibrillation for followup. Overall patient has been doing well. His weight is stable. He is anticoagulated with warfarin. He denies any abnormal bleeding.  Patient has supportive wife and lives with mentally retarded son - age 55.  Patient denies history of falls. He likes to bowl twice a week. He denies depression or anxiety.  Review of Systems Negative for chest pain or shortness of breath  Past Medical History  Diagnosis Date  . Hypertension   . Hyperlipidemia   . History of diverticulitis of colon     history of diverticulosis  . PAF (paroxysmal atrial fibrillation)     on flecainide  . Long term current use of anticoagulant     anticoagulant therapy  . BPH (benign prostatic hyperplasia)   . Balanitis     phimosis, preputial adhesions  . CTS (carpal tunnel syndrome)   . History of sinus bradycardia     history of bradycardia on hig doses of Cardizem  . Left bundle branch block     normal Myoview    History   Social History  . Marital Status: Married    Spouse Name: N/A    Number of Children: N/A  . Years of Education: N/A   Occupational History  . Not on file.   Social History Main Topics  . Smoking status: Former Games developer  . Smokeless tobacco: Never Used  . Alcohol Use: No  . Drug Use: No  . Sexually Active: No   Other Topics Concern  . 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   No tobacco    No alcohol               Past Surgical History  Procedure Laterality Date  . Knee arthroscopy  07/2006    left knee debridement, including partial medial and lateral menisectomy  . Lumbar laminectomy/decompression microdiscectomy  2004    L4-5  . Circumcision  2006  . Carpal tunnel release  2007/2008    Family History  Problem  Relation Age of Onset  . Breast cancer Sister   . Stroke Mother   . Diabetes Mother     Allergies  Allergen Reactions  . Tambocor (Flecainide) Other (See Comments)    Mental Issues.  . Penicillins     REACTION: anaphylaxis    Current Outpatient Prescriptions on File Prior to Visit  Medication Sig Dispense Refill  . Ascorbic Acid (VITAMIN C) 500 MG tablet Take 500 mg by mouth daily.        . B Complex Vitamins (B COMPLEX 1) tablet Take 1 tablet by mouth daily.        . Cholecalciferol (VITAMIN D3) 1000 UNITS tablet Take 1,000 Units by mouth daily.        . clemastine (TAVIST) 2.68 MG TABS Take 2.68 mg by mouth as needed.       . finasteride (PROSCAR) 5 MG tablet Take 1 tablet (5 mg total) by mouth daily.  30 tablet  3  . lisinopril (PRINIVIL,ZESTRIL) 10 MG tablet Take 5 mg by mouth daily.      . mometasone (NASONEX) 50 MCG/ACT nasal spray Place 2 sprays into the nose at bedtime.  17 g  6  . Multiple Vitamins-Minerals (OCUVITE PO) Take by mouth  daily.        . Omega-3 Fatty Acids (FISH OIL) 1000 MG CAPS Take 1,000 capsules by mouth daily.        Marland Kitchen triamcinolone cream (KENALOG) 0.1 % Apply 1 application topically 4 (four) times daily.      Marland Kitchen warfarin (COUMADIN) 5 MG tablet TAKE AS DIRECTED BY ANTICOAGULATION     CLINIC  135 tablet  1  . [DISCONTINUED] diltiazem (CARDIZEM) 30 MG tablet Take 30 mg by mouth 3 (three) times daily.        No current facility-administered medications on file prior to visit.    BP 120/60  Pulse 64  Temp(Src) 97.6 F (36.4 C) (Oral)  Ht 5\' 10"  (1.778 m)  Wt 168 lb (76.204 kg)  BMI 24.11 kg/m2       Objective:   Physical Exam  Constitutional: He is oriented to person, place, and time. He appears well-developed and well-nourished.  HENT:  Head: Normocephalic and atraumatic.  Right Ear: External ear normal.  Left Ear: External ear normal.  Mouth/Throat: Oropharynx is clear and moist.  Neck: Neck supple.  No carotid bruit  Cardiovascular: Normal  rate and regular rhythm.   Pulmonary/Chest: Effort normal and breath sounds normal. He has no wheezes.  Musculoskeletal:  Trace lower extremity edema bilaterally  Neurological: He is alert and oriented to person, place, and time.          Assessment & Plan:

## 2012-09-11 ENCOUNTER — Ambulatory Visit (INDEPENDENT_AMBULATORY_CARE_PROVIDER_SITE_OTHER): Payer: Medicare Other | Admitting: *Deleted

## 2012-09-11 DIAGNOSIS — Z7901 Long term (current) use of anticoagulants: Secondary | ICD-10-CM

## 2012-09-11 DIAGNOSIS — I4891 Unspecified atrial fibrillation: Secondary | ICD-10-CM

## 2012-10-23 ENCOUNTER — Ambulatory Visit (INDEPENDENT_AMBULATORY_CARE_PROVIDER_SITE_OTHER): Payer: Medicare Other | Admitting: Pharmacist

## 2012-10-23 DIAGNOSIS — Z7901 Long term (current) use of anticoagulants: Secondary | ICD-10-CM

## 2012-10-23 DIAGNOSIS — I4891 Unspecified atrial fibrillation: Secondary | ICD-10-CM

## 2012-10-23 LAB — POCT INR: INR: 2.2

## 2012-11-13 ENCOUNTER — Ambulatory Visit (INDEPENDENT_AMBULATORY_CARE_PROVIDER_SITE_OTHER): Payer: Medicare Other | Admitting: Family Medicine

## 2012-11-13 VITALS — BP 130/70 | HR 75 | Temp 98.1°F | Wt 170.0 lb

## 2012-11-13 DIAGNOSIS — T148XXA Other injury of unspecified body region, initial encounter: Secondary | ICD-10-CM

## 2012-11-13 NOTE — Progress Notes (Signed)
Chief Complaint  Patient presents with  . right arm pain    HPI:  Acute visit for arm injury: -hurt arm bowling 3 days ago -thru a ball a certain way and felt sharp pain in L biceps region -has felt better over the last few days - but still hurts with certain movements(pulling with this arm) - no bruising -no pain at rest, mild-mod pain with certain activities -denies weakness or numbness, fevers or brusing ROS: See pertinent positives and negatives per HPI.  Past Medical History  Diagnosis Date  . Hypertension   . Hyperlipidemia   . History of diverticulitis of colon     history of diverticulosis  . PAF (paroxysmal atrial fibrillation)     on flecainide  . Long term current use of anticoagulant     anticoagulant therapy  . BPH (benign prostatic hyperplasia)   . Balanitis     phimosis, preputial adhesions  . CTS (carpal tunnel syndrome)   . History of sinus bradycardia     history of bradycardia on hig doses of Cardizem  . Left bundle branch block     normal Myoview    Family History  Problem Relation Age of Onset  . Breast cancer Sister   . Stroke Mother   . Diabetes Mother     History   Social History  . Marital Status: Married    Spouse Name: N/A    Number of Children: N/A  . Years of Education: N/A   Social History Main Topics  . Smoking status: Former Games developer  . Smokeless tobacco: Never Used  . Alcohol Use: No  . Drug Use: No  . Sexually Active: No   Other Topics Concern  . 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   No tobacco    No alcohol               Current outpatient prescriptions:Ascorbic Acid (VITAMIN C) 500 MG tablet, Take 500 mg by mouth daily.  , Disp: , Rfl: ;  B Complex Vitamins (B COMPLEX 1) tablet, Take 1 tablet by mouth daily.  , Disp: , Rfl: ;  Cholecalciferol (VITAMIN D3) 1000 UNITS tablet, Take 1,000 Units by mouth daily.  , Disp: , Rfl: ;  clemastine (TAVIST) 2.68 MG TABS, Take  2.68 mg by mouth as needed. , Disp: , Rfl:  finasteride (PROSCAR) 5 MG tablet, Take 1 tablet (5 mg total) by mouth daily., Disp: 30 tablet, Rfl: 3;  lisinopril (PRINIVIL,ZESTRIL) 10 MG tablet, Take 5 mg by mouth daily., Disp: , Rfl: ;  mometasone (NASONEX) 50 MCG/ACT nasal spray, Place 2 sprays into the nose at bedtime., Disp: 17 g, Rfl: 6;  Multiple Vitamins-Minerals (OCUVITE PO), Take by mouth daily.  , Disp: , Rfl:  Omega-3 Fatty Acids (FISH OIL) 1000 MG CAPS, Take 1,000 capsules by mouth daily.  , Disp: , Rfl: ;  triamcinolone cream (KENALOG) 0.1 %, Apply 1 application topically 4 (four) times daily., Disp: , Rfl: ;  warfarin (COUMADIN) 5 MG tablet, TAKE AS DIRECTED BY ANTICOAGULATION     CLINIC, Disp: 135 tablet, Rfl: 1;  [DISCONTINUED] diltiazem (CARDIZEM) 30 MG tablet, Take 30 mg by mouth 3 (three) times daily. , Disp: , Rfl:   EXAM:  Filed Vitals:   11/13/12 1502  BP: 130/70  Pulse: 75  Temp: 98.1 F (36.7 C)    Body mass index is 24.39 kg/(m^2).  GENERAL: vitals reviewed and listed  above, alert, oriented, appears well hydrated and in no acute distress  HEENT: atraumatic, conjunttiva clear, no obvious abnormalities on inspection of external nose and ears  NECK: no obvious masses on inspection  LUNGS: clear to auscultation bilaterally, no wheezes, rales or rhonchi, good air movement  CV: HRRR, no peripheral edema  MS: moves all extremities without noticeable abnormality -tTP bicep muscle - no bulging, bruising or weakness O/w normal ROM, strength and exam in UE bilat  PSYCH: pleasant and cooperative, no obvious depression or anxiety  ASSESSMENT AND PLAN:  Discussed the following assessment and plan:  Muscle strain -supportive tx, heat, HEP -follow up 4 weeks -Patient advised to return or notify a doctor immediately if symptoms worsen or persist or new concerns arise.  Patient Instructions  -heat for 15 minutes twice daily  -can use topical sports creams (menthol or  capsacin) and tylenol 500mg  up to 3 times daily as needed for pain  -exercises provided  -take 1-2 weeks off from bowling then slowly return to this if tolerated  -follow up in 4 weeks      KIM, HANNAH R.

## 2012-11-13 NOTE — Patient Instructions (Signed)
-  heat for 15 minutes twice daily  -can use topical sports creams (menthol or capsacin) and tylenol 500mg  up to 3 times daily as needed for pain  -exercises provided  -take 1-2 weeks off from bowling then slowly return to this if tolerated  -follow up in 4 weeks

## 2012-11-21 ENCOUNTER — Ambulatory Visit: Payer: Medicare Other | Admitting: Cardiology

## 2012-11-28 ENCOUNTER — Ambulatory Visit (INDEPENDENT_AMBULATORY_CARE_PROVIDER_SITE_OTHER): Payer: Medicare Other | Admitting: *Deleted

## 2012-11-28 ENCOUNTER — Encounter: Payer: Self-pay | Admitting: Cardiology

## 2012-11-28 ENCOUNTER — Ambulatory Visit (INDEPENDENT_AMBULATORY_CARE_PROVIDER_SITE_OTHER): Payer: Medicare Other | Admitting: Cardiology

## 2012-11-28 VITALS — BP 109/60 | HR 58 | Ht 68.0 in | Wt 169.0 lb

## 2012-11-28 DIAGNOSIS — I4891 Unspecified atrial fibrillation: Secondary | ICD-10-CM

## 2012-11-28 DIAGNOSIS — I10 Essential (primary) hypertension: Secondary | ICD-10-CM

## 2012-11-28 DIAGNOSIS — E785 Hyperlipidemia, unspecified: Secondary | ICD-10-CM

## 2012-11-28 DIAGNOSIS — Z7901 Long term (current) use of anticoagulants: Secondary | ICD-10-CM

## 2012-11-28 NOTE — Assessment & Plan Note (Signed)
Blood pressure controlled.continue present medications. 

## 2012-11-28 NOTE — Assessment & Plan Note (Signed)
Management per primary care. 

## 2012-11-28 NOTE — Assessment & Plan Note (Signed)
Patient remains in sinus rhythm. Continue Coumadin. I have given him the name of apixiban. He will check with his insurance company and if covered he will consider changing. Patient's electrocardiogram with question new lateral infarct. Repeat echocardiogram.

## 2012-11-28 NOTE — Progress Notes (Signed)
HPI: Mr. Doescher is a very pleasant gentleman who has a history of paroxysmal atrial fibrillation and preserved LV function. We repeated an echocardiogram in Jan 2013 that showed normal LV function, grade 2 diastolic dysfunction, mild mitral regurgitation, mild to moderate PI and mild left atrial enlargement. FU monitor in Jan 2013 showed PAF. TSH normal. Lexiscan Myoview in March of 2013 showed an ejection fraction of 54% and normal perfusion. Patient seen in June of 2013 for syncope. Patient's flecainide was discontinued. Head CT showed no skull fracture. There was a right frontal extra-axial process suggesting a vessel rather than a subdural hematoma. Event monitor showed sinus rhythm with PACs. It was felt his syncopal episode may have been related to dehydration. I last saw him in Oct of 2013. Since then, the patient denies any dyspnea on exertion, orthopnea, PND, pedal edema, palpitations, syncope or chest pain.   Current Outpatient Prescriptions  Medication Sig Dispense Refill  . Ascorbic Acid (VITAMIN C) 500 MG tablet Take 500 mg by mouth daily.        . B Complex Vitamins (B COMPLEX 1) tablet Take 1 tablet by mouth daily.        . Cholecalciferol (VITAMIN D3) 1000 UNITS tablet Take 1,000 Units by mouth daily.        . clemastine (TAVIST) 2.68 MG TABS Take 2.68 mg by mouth as needed.       . finasteride (PROSCAR) 5 MG tablet Take 1 tablet (5 mg total) by mouth daily.  30 tablet  3  . lisinopril (PRINIVIL,ZESTRIL) 10 MG tablet Take 5 mg by mouth daily.      . mometasone (NASONEX) 50 MCG/ACT nasal spray Place 2 sprays into the nose at bedtime.  17 g  6  . Multiple Vitamins-Minerals (OCUVITE PO) Take by mouth daily.        . Omega-3 Fatty Acids (FISH OIL) 1000 MG CAPS Take 1,000 capsules by mouth daily.        Marland Kitchen triamcinolone cream (KENALOG) 0.1 % Apply 1 application topically 4 (four) times daily.      Marland Kitchen warfarin (COUMADIN) 5 MG tablet TAKE AS DIRECTED BY ANTICOAGULATION     CLINIC  135 tablet   1  . [DISCONTINUED] diltiazem (CARDIZEM) 30 MG tablet Take 30 mg by mouth 3 (three) times daily.        No current facility-administered medications for this visit.     Past Medical History  Diagnosis Date  . Hypertension   . Hyperlipidemia   . History of diverticulitis of colon     history of diverticulosis  . PAF (paroxysmal atrial fibrillation)     on flecainide  . Long term current use of anticoagulant     anticoagulant therapy  . BPH (benign prostatic hyperplasia)   . Balanitis     phimosis, preputial adhesions  . CTS (carpal tunnel syndrome)   . History of sinus bradycardia     history of bradycardia on hig doses of Cardizem  . Left bundle branch block     normal Myoview    Past Surgical History  Procedure Laterality Date  . Knee arthroscopy  07/2006    left knee debridement, including partial medial and lateral menisectomy  . Lumbar laminectomy/decompression microdiscectomy  2004    L4-5  . Circumcision  2006  . Carpal tunnel release  2007/2008    History   Social History  . Marital Status: Married    Spouse Name: N/A    Number of Children:  N/A  . Years of Education: N/A   Occupational History  . Not on file.   Social History Main Topics  . Smoking status: Former Games developer  . Smokeless tobacco: Never Used  . Alcohol Use: No  . Drug Use: No  . Sexually Active: No   Other Topics Concern  . 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   No tobacco    No alcohol               ROS: no fevers or chills, productive cough, hemoptysis, dysphasia, odynophagia, melena, hematochezia, dysuria, hematuria, rash, seizure activity, orthopnea, PND, pedal edema, claudication. Remaining systems are negative.  Physical Exam: Well-developed well-nourished in no acute distress.  Skin is warm and dry.  HEENT is normal.  Neck is supple.  Chest is clear to auscultation with normal expansion.  Cardiovascular exam is regular  rate and rhythm.  Abdominal exam nontender or distended. No masses palpated. Extremities show no edema. neuro grossly intact  ECG in sinus rhythm at a rate of 58. Right bundle branch block, prior lateral infarct, inferior T-wave inversion.

## 2012-11-28 NOTE — Patient Instructions (Addendum)
Your physician wants you to follow-up in: 6 MONTHS WITH DR Jens Som You will receive a reminder letter in the mail two months in advance. If you don't receive a letter, please call our office to schedule the follow-up appointment.   Your physician has requested that you have an echocardiogram. Echocardiography is a painless test that uses sound waves to create images of your heart. It provides your doctor with information about the size and shape of your heart and how well your heart's chambers and valves are working. This procedure takes approximately one hour. There are no restrictions for this procedure.   ELIQUIS OR APIXIBAN

## 2012-11-30 ENCOUNTER — Ambulatory Visit (HOSPITAL_COMMUNITY): Payer: Medicare Other | Attending: Cardiology

## 2012-11-30 ENCOUNTER — Other Ambulatory Visit (HOSPITAL_COMMUNITY): Payer: Medicare Other

## 2012-11-30 DIAGNOSIS — I10 Essential (primary) hypertension: Secondary | ICD-10-CM | POA: Insufficient documentation

## 2012-11-30 DIAGNOSIS — I447 Left bundle-branch block, unspecified: Secondary | ICD-10-CM | POA: Insufficient documentation

## 2012-11-30 DIAGNOSIS — E785 Hyperlipidemia, unspecified: Secondary | ICD-10-CM | POA: Insufficient documentation

## 2012-11-30 DIAGNOSIS — I4891 Unspecified atrial fibrillation: Secondary | ICD-10-CM

## 2012-11-30 NOTE — Progress Notes (Signed)
Echocardiogram performed.  

## 2012-12-11 ENCOUNTER — Other Ambulatory Visit: Payer: Self-pay | Admitting: Cardiology

## 2012-12-22 IMAGING — CR DG ANKLE 2V *L*
2 series · 2 of 2 positions shown · non-contrast
Comparison: Foot examination of same date.

CLINICAL DATA: History of heel and foot pain.  Pain and plantar
region.  Lateral and arch pain.

LEFT ANKLE - 2 VIEW

[t ankle joint ap left]
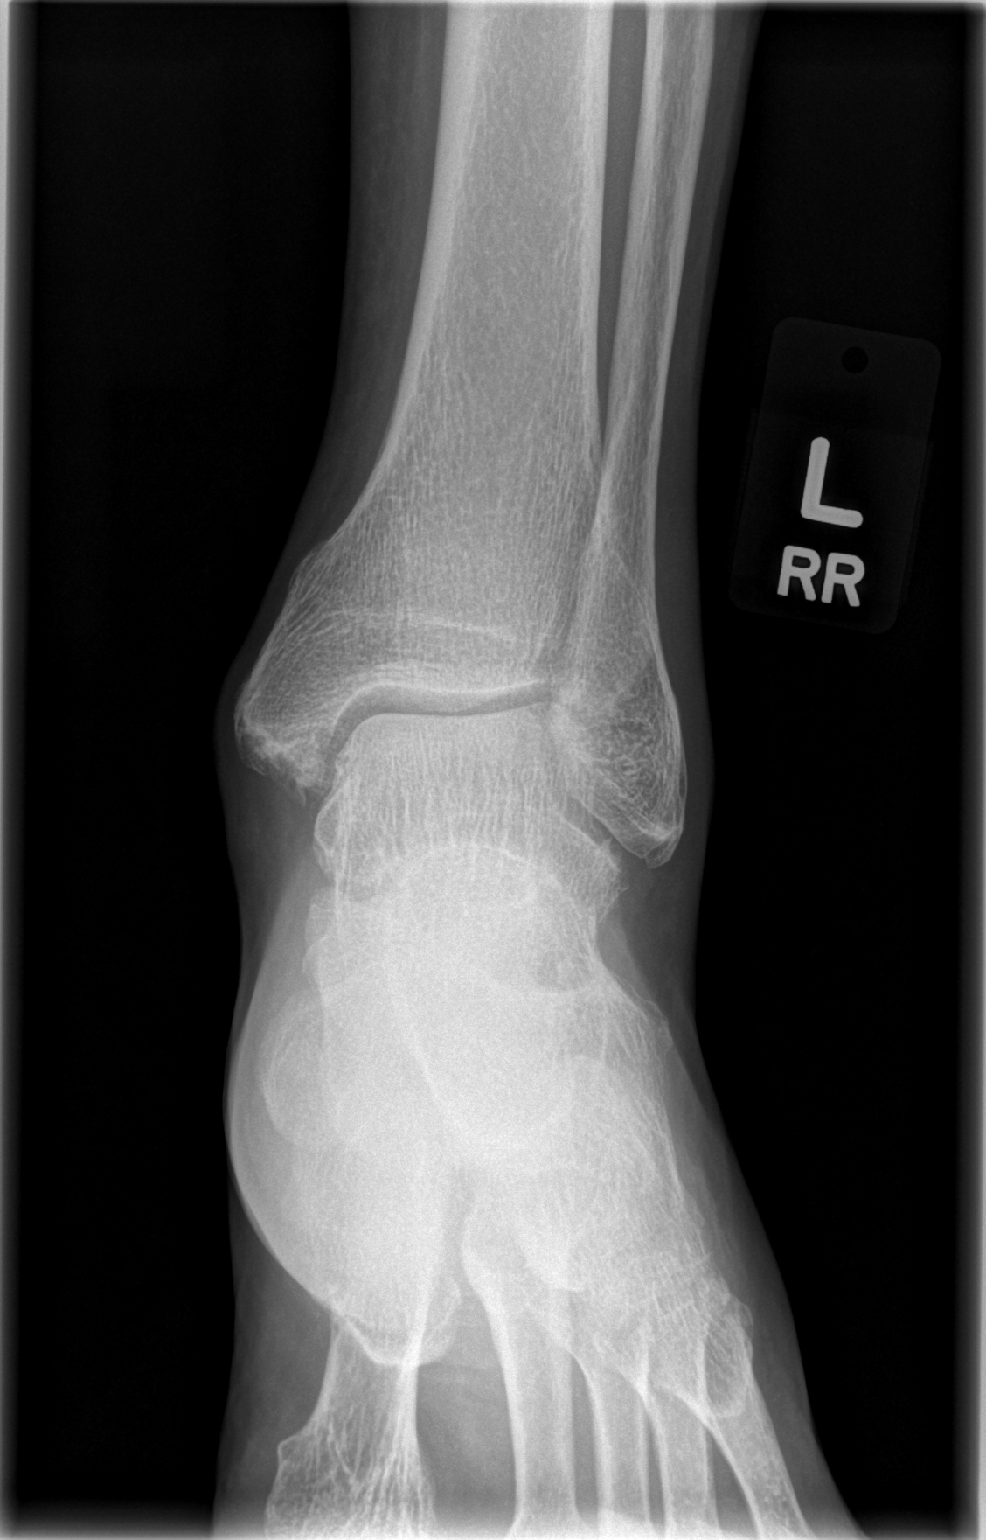

[t ankle joint lat left]
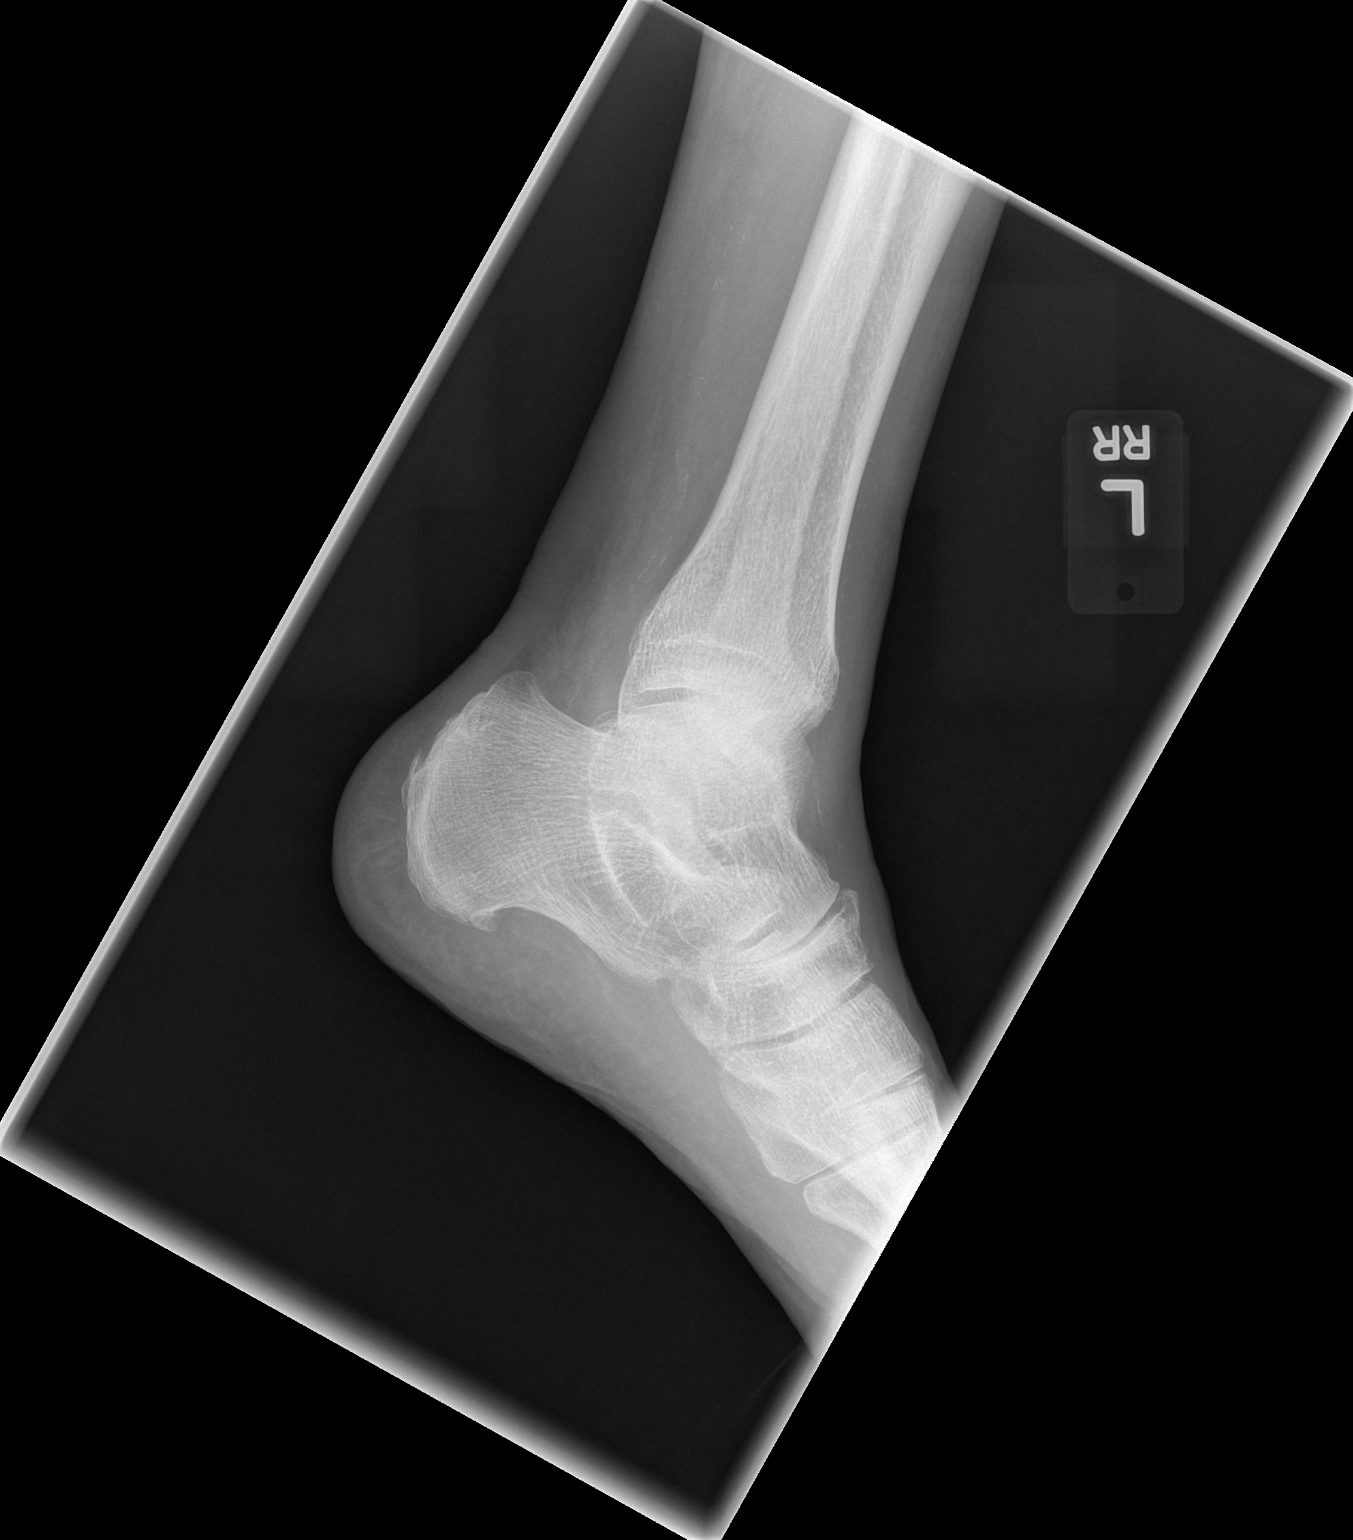

[2 of 2 positions shown; findings below may reference images not displayed]

FINDINGS: Joint spaces are preserved.  Alignment appears normal.
No fracture is evident.  There is degenerative spurring of the
distal portion of the medial malleolus.  Posterior and plantar
calcaneal spurring is seen.  There is osteophyte formation
involving the dorsal aspect of navicula at talonavicular joint.
IMPRESSION: No fracture is evident.  Degenerative spurring is present.
Calcaneal spurring.

## 2012-12-22 IMAGING — CR DG FOOT COMPLETE 3+V*L*
3 series · 3 of 3 positions shown · non-contrast
Comparison: Ankle examination of same date.

CLINICAL DATA: History given of plantar foot pain.  Lateral pain.
Arch pain.

LEFT FOOT - COMPLETE 3+ VIEW

[t foot ap left]
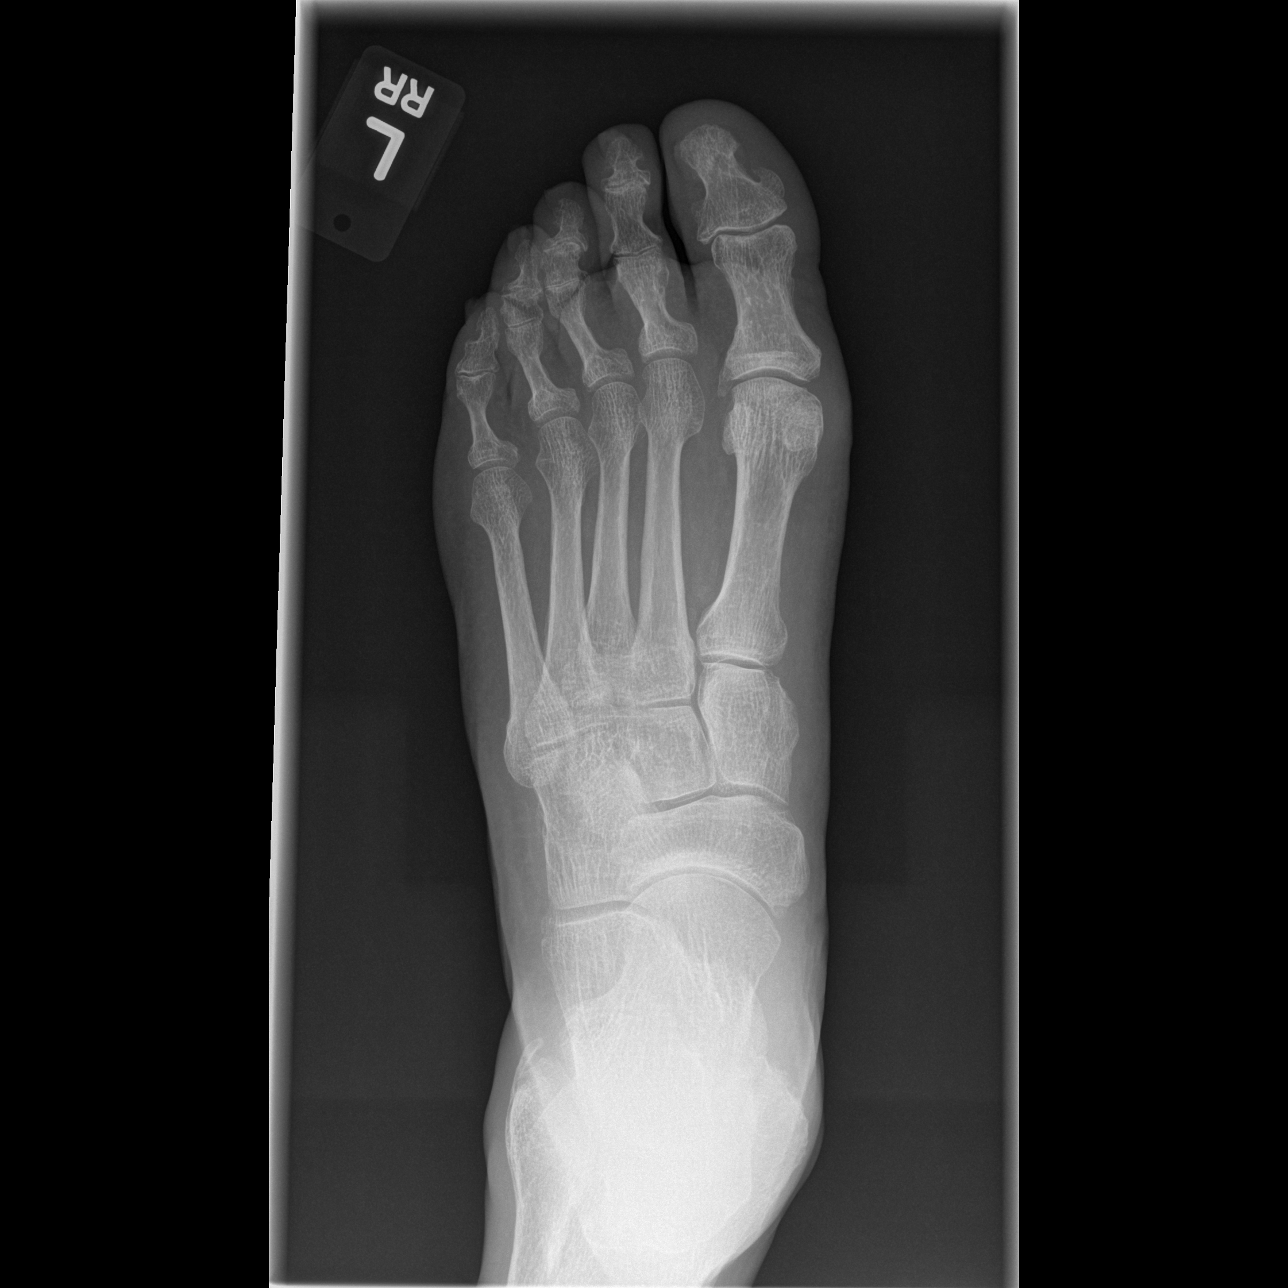

[t foot oblique left]
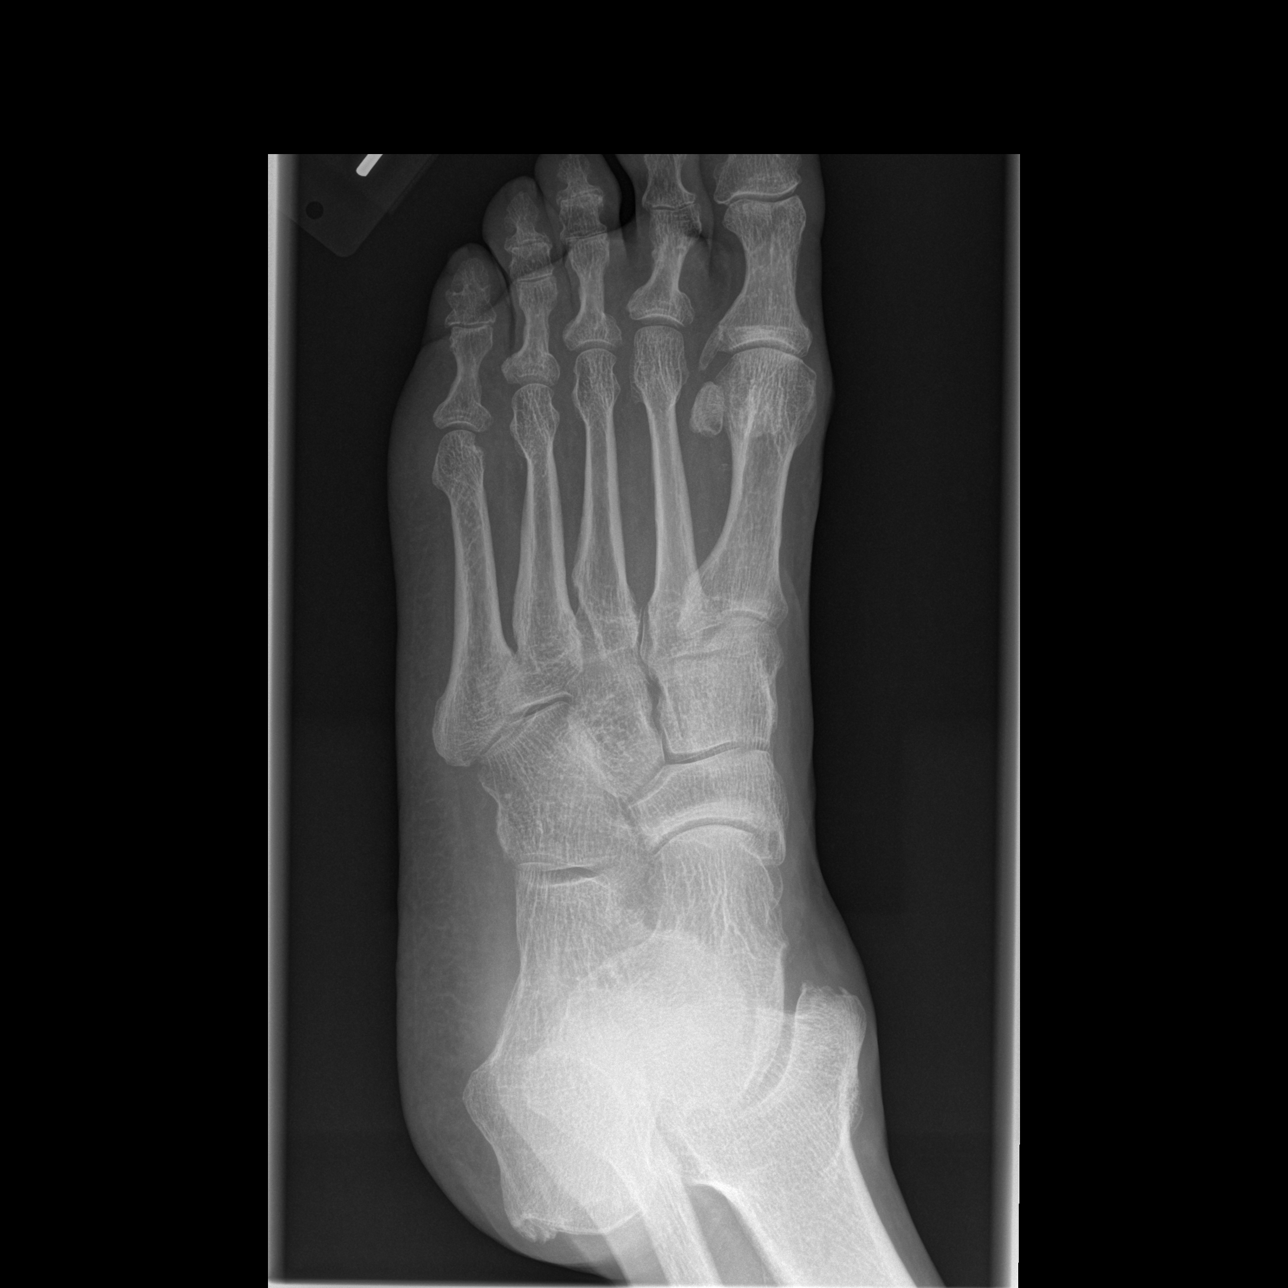

[t foot lat left]
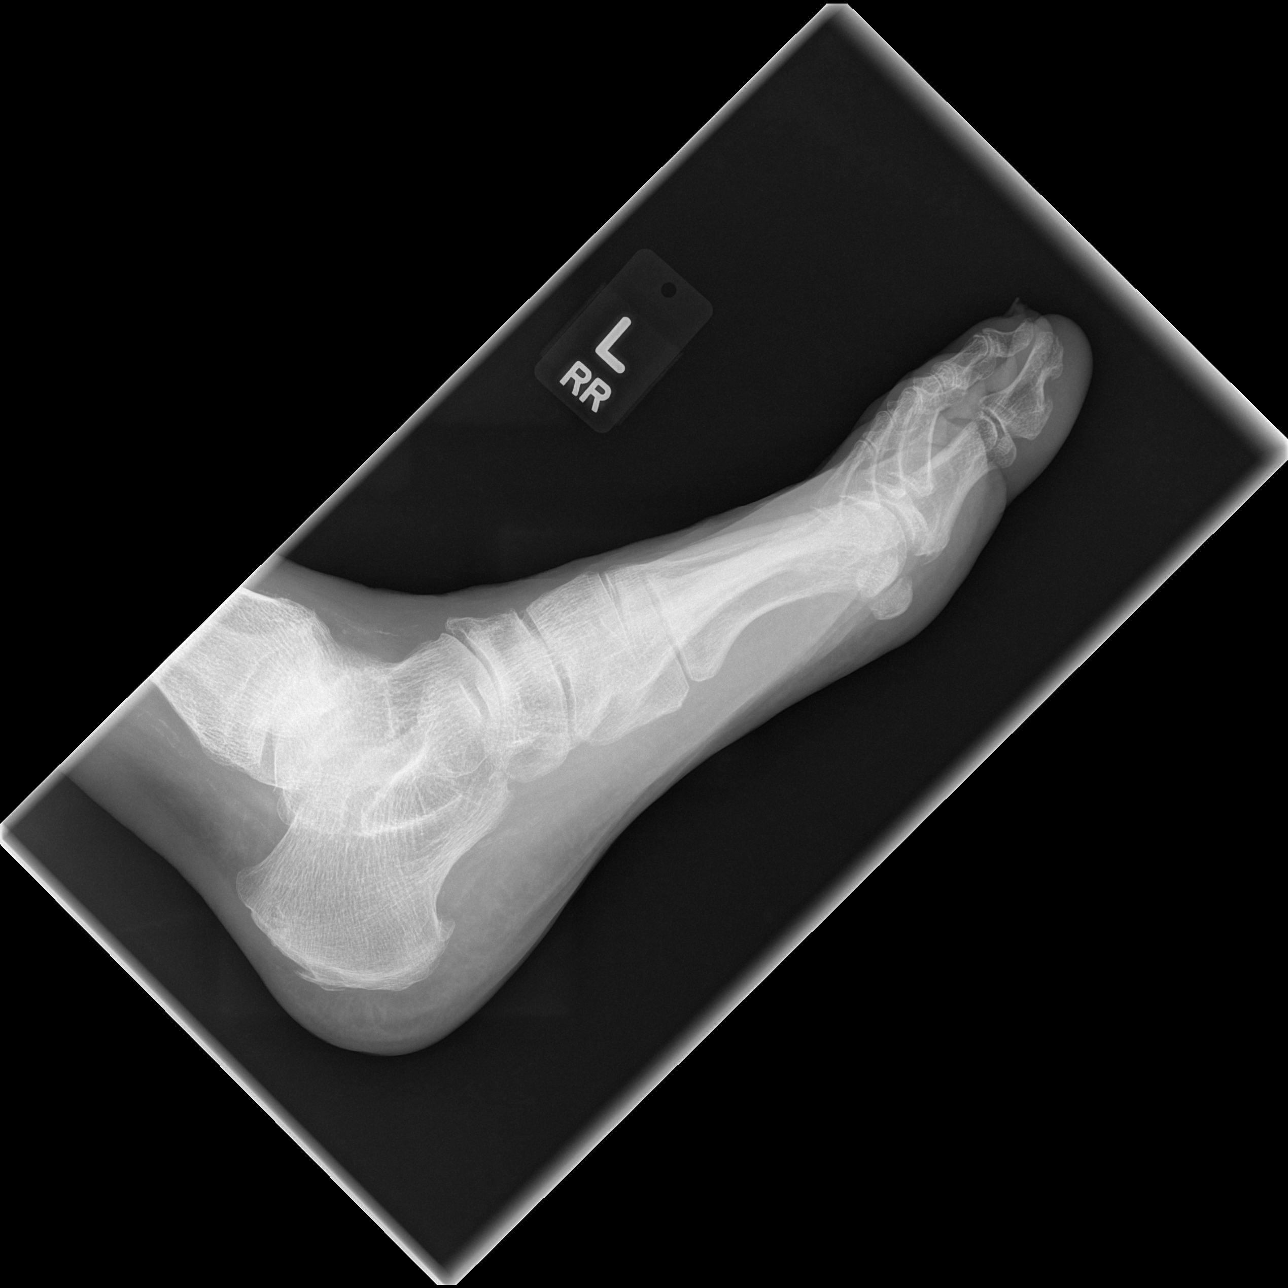

[3 of 3 positions shown; findings below may reference images not displayed]

FINDINGS: No evidence of fracture or dislocation.  Osteopenic
appearance of bones.  Pes planus configuration.  Degenerative
spurring of the dorsal aspect of the navicular at the talonavicular
joint.  Posterior and plantar calcaneal spurring.  Large osteophyte
arising from the proximal lateral aspect of the proximal phalanx of
the first toe.
IMPRESSION: No fracture or dislocation evident.  Pes planus configuration.
Degenerative spurring is detailed above.

## 2013-01-09 ENCOUNTER — Ambulatory Visit (INDEPENDENT_AMBULATORY_CARE_PROVIDER_SITE_OTHER): Payer: Medicare Other | Admitting: *Deleted

## 2013-01-09 DIAGNOSIS — Z7901 Long term (current) use of anticoagulants: Secondary | ICD-10-CM

## 2013-01-09 DIAGNOSIS — I4891 Unspecified atrial fibrillation: Secondary | ICD-10-CM

## 2013-02-20 ENCOUNTER — Ambulatory Visit (INDEPENDENT_AMBULATORY_CARE_PROVIDER_SITE_OTHER): Payer: Medicare Other | Admitting: *Deleted

## 2013-02-20 DIAGNOSIS — Z7901 Long term (current) use of anticoagulants: Secondary | ICD-10-CM

## 2013-02-20 DIAGNOSIS — I4891 Unspecified atrial fibrillation: Secondary | ICD-10-CM

## 2013-02-20 LAB — POCT INR: INR: 1.6

## 2013-03-13 ENCOUNTER — Ambulatory Visit (INDEPENDENT_AMBULATORY_CARE_PROVIDER_SITE_OTHER): Payer: Medicare Other | Admitting: *Deleted

## 2013-03-13 DIAGNOSIS — Z7901 Long term (current) use of anticoagulants: Secondary | ICD-10-CM

## 2013-03-13 DIAGNOSIS — I4891 Unspecified atrial fibrillation: Secondary | ICD-10-CM

## 2013-03-13 LAB — POCT INR: INR: 1.6

## 2013-03-28 ENCOUNTER — Ambulatory Visit (INDEPENDENT_AMBULATORY_CARE_PROVIDER_SITE_OTHER): Payer: Medicare Other | Admitting: *Deleted

## 2013-03-28 DIAGNOSIS — Z7901 Long term (current) use of anticoagulants: Secondary | ICD-10-CM

## 2013-03-28 DIAGNOSIS — I4891 Unspecified atrial fibrillation: Secondary | ICD-10-CM

## 2013-03-28 LAB — POCT INR: INR: 3.8

## 2013-04-03 ENCOUNTER — Ambulatory Visit (INDEPENDENT_AMBULATORY_CARE_PROVIDER_SITE_OTHER): Payer: Medicare Other

## 2013-04-03 DIAGNOSIS — Z23 Encounter for immunization: Secondary | ICD-10-CM

## 2013-04-11 ENCOUNTER — Ambulatory Visit (INDEPENDENT_AMBULATORY_CARE_PROVIDER_SITE_OTHER): Payer: Medicare Other | Admitting: *Deleted

## 2013-04-11 DIAGNOSIS — Z7901 Long term (current) use of anticoagulants: Secondary | ICD-10-CM

## 2013-04-11 DIAGNOSIS — I4891 Unspecified atrial fibrillation: Secondary | ICD-10-CM

## 2013-04-25 ENCOUNTER — Ambulatory Visit (INDEPENDENT_AMBULATORY_CARE_PROVIDER_SITE_OTHER): Payer: Medicare Other | Admitting: *Deleted

## 2013-04-25 DIAGNOSIS — Z7901 Long term (current) use of anticoagulants: Secondary | ICD-10-CM

## 2013-04-25 DIAGNOSIS — I4891 Unspecified atrial fibrillation: Secondary | ICD-10-CM

## 2013-05-09 ENCOUNTER — Ambulatory Visit (INDEPENDENT_AMBULATORY_CARE_PROVIDER_SITE_OTHER): Payer: Medicare Other | Admitting: *Deleted

## 2013-05-09 DIAGNOSIS — Z7901 Long term (current) use of anticoagulants: Secondary | ICD-10-CM

## 2013-05-09 DIAGNOSIS — I4891 Unspecified atrial fibrillation: Secondary | ICD-10-CM

## 2013-05-09 LAB — POCT INR: INR: 2.7

## 2013-05-22 ENCOUNTER — Encounter: Payer: Medicare Other | Admitting: Internal Medicine

## 2013-05-23 ENCOUNTER — Ambulatory Visit (INDEPENDENT_AMBULATORY_CARE_PROVIDER_SITE_OTHER): Payer: Medicare Other | Admitting: *Deleted

## 2013-05-23 DIAGNOSIS — Z7901 Long term (current) use of anticoagulants: Secondary | ICD-10-CM

## 2013-05-23 DIAGNOSIS — I4891 Unspecified atrial fibrillation: Secondary | ICD-10-CM

## 2013-05-23 LAB — POCT INR: INR: 1.8

## 2013-05-29 ENCOUNTER — Ambulatory Visit (INDEPENDENT_AMBULATORY_CARE_PROVIDER_SITE_OTHER): Payer: Medicare Other | Admitting: Cardiology

## 2013-05-29 ENCOUNTER — Encounter: Payer: Self-pay | Admitting: Cardiology

## 2013-05-29 VITALS — BP 120/60 | HR 62 | Ht 68.0 in | Wt 168.0 lb

## 2013-05-29 DIAGNOSIS — I10 Essential (primary) hypertension: Secondary | ICD-10-CM

## 2013-05-29 DIAGNOSIS — E785 Hyperlipidemia, unspecified: Secondary | ICD-10-CM

## 2013-05-29 DIAGNOSIS — I4891 Unspecified atrial fibrillation: Secondary | ICD-10-CM

## 2013-05-29 NOTE — Assessment & Plan Note (Signed)
Blood pressure controlled. Continue present medications. 

## 2013-05-29 NOTE — Progress Notes (Signed)
HPI: FU paroxysmal atrial fibrillation. FU monitor in Jan 2013 showed PAF. TSH normal. Lexiscan Myoview in March of 2013 showed an ejection fraction of 54% and normal perfusion. Patient seen in June of 2013 for syncope. Patient's flecainide was discontinued. Head CT showed no skull fracture. There was a right frontal extra-axial process suggesting a vessel rather than a subdural hematoma. Event monitor showed sinus rhythm with PACs. It was felt his syncopal episode may have been related to dehydration. Last echocardiogram in May 2014 showed normal LV function, moderate mitral regurgitation. I last saw him in May of 2014. Since then, the patient denies any dyspnea on exertion, orthopnea, PND, pedal edema, syncope or chest pain. He has had 2 bouts of atrial fibrillation. These lasted several hours and resolved spontaneously.   Current Outpatient Prescriptions  Medication Sig Dispense Refill  . Ascorbic Acid (VITAMIN C) 500 MG tablet Take 500 mg by mouth daily.        . B Complex Vitamins (B COMPLEX 1) tablet Take 1 tablet by mouth daily.        . Cholecalciferol (VITAMIN D3) 1000 UNITS tablet Take 1,000 Units by mouth daily.        . clemastine (TAVIST) 2.68 MG TABS Take 2.68 mg by mouth as needed.       . finasteride (PROSCAR) 5 MG tablet Take 1 tablet (5 mg total) by mouth daily.  30 tablet  3  . lisinopril (PRINIVIL,ZESTRIL) 10 MG tablet Take 5 mg by mouth daily.      . mometasone (NASONEX) 50 MCG/ACT nasal spray Place 2 sprays into the nose at bedtime.  17 g  6  . Multiple Vitamins-Minerals (OCUVITE PO) Take by mouth daily.        . Omega-3 Fatty Acids (FISH OIL) 1000 MG CAPS Take 1,000 capsules by mouth daily.        Marland Kitchen triamcinolone cream (KENALOG) 0.1 % Apply 1 application topically 4 (four) times daily.      Marland Kitchen warfarin (COUMADIN) 5 MG tablet TAKE AS DIRECTED BY ANTICOAGULATION CLINIC  135 tablet  1  . [DISCONTINUED] diltiazem (CARDIZEM) 30 MG tablet Take 30 mg by mouth 3 (three) times  daily.        No current facility-administered medications for this visit.     Past Medical History  Diagnosis Date  . Hypertension   . Hyperlipidemia   . History of diverticulitis of colon     history of diverticulosis  . PAF (paroxysmal atrial fibrillation)     on flecainide  . Long term current use of anticoagulant     anticoagulant therapy  . BPH (benign prostatic hyperplasia)   . Balanitis     phimosis, preputial adhesions  . CTS (carpal tunnel syndrome)   . History of sinus bradycardia     history of bradycardia on hig doses of Cardizem  . Left bundle branch block     normal Myoview    Past Surgical History  Procedure Laterality Date  . Knee arthroscopy  07/2006    left knee debridement, including partial medial and lateral menisectomy  . Lumbar laminectomy/decompression microdiscectomy  2004    L4-5  . Circumcision  2006  . Carpal tunnel release  2007/2008    History   Social History  . Marital Status: Married    Spouse Name: N/A    Number of Children: N/A  . Years of Education: N/A   Occupational History  . Not on file.  Social History Main Topics  . Smoking status: Former Games developer  . Smokeless tobacco: Never Used  . Alcohol Use: No  . Drug Use: No  . Sexual Activity: No   Other Topics Concern  . 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   No tobacco    No alcohol               ROS: no fevers or chills, productive cough, hemoptysis, dysphasia, odynophagia, melena, hematochezia, dysuria, hematuria, rash, seizure activity, orthopnea, PND, pedal edema, claudication. Remaining systems are negative.  Physical Exam: Well-developed well-nourished in no acute distress.  Skin is warm and dry.  HEENT is normal.  Neck is supple.  Chest is clear to auscultation with normal expansion.  Cardiovascular exam is regular rate and rhythm.  Abdominal exam nontender or distended. No masses palpated. Extremities  show no edema. neuro grossly intact  ECG sinus rhythm, IVCD.

## 2013-05-29 NOTE — Patient Instructions (Signed)
Your physician wants you to follow-up in: 6 MONTHS WITH DR Jens Som You will receive a reminder letter in the mail two months in advance. If you don't receive a letter, please call our office to schedule the follow-up appointment.   ELIQUIS TO REPLACE WARFARIN  CALL ME WITH YOUR MEDICINE

## 2013-05-29 NOTE — Assessment & Plan Note (Signed)
Patient is in sinus today. 2 brief episodes of atrial fibrillation. Plan continue calcium blocker and Coumadin. He may require an antiarrhythmic in the future such as amiodarone. He is not clear about his medications. He will contact does with what he is taking. We discussed a new oral anticoagulant today. He will check about the cost and if feasible we will change from Coumadin to apixaban 5 BID.

## 2013-05-29 NOTE — Assessment & Plan Note (Signed)
Management per primary care. 

## 2013-05-30 ENCOUNTER — Telehealth: Payer: Self-pay | Admitting: Cardiology

## 2013-05-30 ENCOUNTER — Encounter: Payer: Self-pay | Admitting: Internal Medicine

## 2013-05-30 ENCOUNTER — Ambulatory Visit (INDEPENDENT_AMBULATORY_CARE_PROVIDER_SITE_OTHER): Payer: Medicare Other | Admitting: Internal Medicine

## 2013-05-30 VITALS — BP 100/68 | HR 60 | Temp 97.7°F | Ht 67.0 in | Wt 170.0 lb

## 2013-05-30 DIAGNOSIS — G3184 Mild cognitive impairment, so stated: Secondary | ICD-10-CM

## 2013-05-30 DIAGNOSIS — E785 Hyperlipidemia, unspecified: Secondary | ICD-10-CM

## 2013-05-30 DIAGNOSIS — F0390 Unspecified dementia without behavioral disturbance: Secondary | ICD-10-CM | POA: Insufficient documentation

## 2013-05-30 DIAGNOSIS — I4891 Unspecified atrial fibrillation: Secondary | ICD-10-CM

## 2013-05-30 DIAGNOSIS — R413 Other amnesia: Secondary | ICD-10-CM

## 2013-05-30 DIAGNOSIS — Z0000 Encounter for general adult medical examination without abnormal findings: Secondary | ICD-10-CM | POA: Insufficient documentation

## 2013-05-30 DIAGNOSIS — D696 Thrombocytopenia, unspecified: Secondary | ICD-10-CM

## 2013-05-30 DIAGNOSIS — I10 Essential (primary) hypertension: Secondary | ICD-10-CM

## 2013-05-30 LAB — BASIC METABOLIC PANEL
Chloride: 104 mEq/L (ref 96–112)
Creatinine, Ser: 1.1 mg/dL (ref 0.4–1.5)
Sodium: 138 mEq/L (ref 135–145)

## 2013-05-30 LAB — CBC WITH DIFFERENTIAL/PLATELET
Basophils Relative: 0.4 % (ref 0.0–3.0)
Eosinophils Relative: 2.6 % (ref 0.0–5.0)
Hemoglobin: 14.1 g/dL (ref 13.0–17.0)
Lymphocytes Relative: 25.1 % (ref 12.0–46.0)
MCV: 93.1 fl (ref 78.0–100.0)
Monocytes Absolute: 0.8 10*3/uL (ref 0.1–1.0)
Neutro Abs: 4.2 10*3/uL (ref 1.4–7.7)
Neutrophils Relative %: 60.7 % (ref 43.0–77.0)
RBC: 4.51 Mil/uL (ref 4.22–5.81)
WBC: 6.9 10*3/uL (ref 4.5–10.5)

## 2013-05-30 MED ORDER — DONEPEZIL HCL 5 MG PO TABS: 5.0000 mg | ORAL_TABLET | Freq: Every day | ORAL | Status: DC

## 2013-05-30 MED ORDER — PRAVASTATIN SODIUM 20 MG PO TABS: 10.0000 mg | ORAL_TABLET | Freq: Every day | ORAL | Status: DC

## 2013-05-30 MED ORDER — LISINOPRIL 10 MG PO TABS: 10.0000 mg | ORAL_TABLET | Freq: Every day | ORAL | Status: DC

## 2013-05-30 MED ORDER — MAGNESIUM 400 MG PO CAPS: 1.0000 | ORAL_CAPSULE | Freq: Every day | ORAL | Status: DC

## 2013-05-30 MED ORDER — FINASTERIDE 5 MG PO TABS: 5.0000 mg | ORAL_TABLET | Freq: Every day | ORAL | Status: AC

## 2013-05-30 NOTE — Progress Notes (Signed)
Subjective:    Patient ID: Geoffrey Romero, male    DOB: 12/22/1923, 79 y.o.   MRN: [id number]  HPI  79 year old white male with history of hypertension, hyperlipidemia, and atrial fibrillation for routine medicare physical and follow up.  Medicare wellness questionnaire reviewed in detail.  See attached form.  His last eye exam was less than 6 months ago.  No report of glaucoma or macular degeneration.  Completed mini mental status exam.  He had difficulty with counting backwards from 100 by 7 and spelling WORLD backwards.  Mild difficulty with clock drawing.  Hypertension - stable.   AFib - no abnormal bleeding. 10 year cardiovascular risk calculated   Review of Systems  Constitutional: Negative for activity change, appetite change and unexpected weight change.  Eyes: Negative for visual disturbance.  Respiratory: Negative for cough, chest tightness and shortness of breath.   Cardiovascular: Negative for chest pain.  Genitourinary: Negative for difficulty urinating.  Neurological: Negative for headaches.  Gastrointestinal: Negative for abdominal pain, heartburn melena or hematochezia Psych: Negative for depression or anxiety Endo:  No polyuria or polydypsia    Past Medical History  Diagnosis Date  . Hypertension   . Hyperlipidemia   . History of diverticulitis of colon     history of diverticulosis  . PAF (paroxysmal atrial fibrillation)     on flecainide  . Long term current use of anticoagulant     anticoagulant therapy  . BPH (benign prostatic hyperplasia)   . Balanitis     phimosis, preputial adhesions  . CTS (carpal tunnel syndrome)   . History of sinus bradycardia     history of bradycardia on hig doses of Cardizem  . Left bundle branch block     normal Myoview    History   Social History  . Marital Status: Married    Spouse Name: N/A    Number of Children: N/A  . Years of Education: N/A   Occupational History  . Not on file.   Social History Main  Topics  . Smoking status: Former Games developer  . Smokeless tobacco: Never Used  . Alcohol Use: No  . Drug Use: No  . Sexual Activity: No   Other Topics Concern  . 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   No tobacco    No alcohol               Past Surgical History  Procedure Laterality Date  . Knee arthroscopy  07/2006    left knee debridement, including partial medial and lateral menisectomy  . Lumbar laminectomy/decompression microdiscectomy  2004    L4-5  . Circumcision  2006  . Carpal tunnel release  2007/2008    Family History  Problem Relation Age of Onset  . Breast cancer Sister   . Stroke Mother   . Diabetes Mother     Allergies  Allergen Reactions  . Tambocor [Flecainide] Other (See Comments)    Mental Issues.  . Penicillins     REACTION: anaphylaxis    Current Outpatient Prescriptions on File Prior to Visit  Medication Sig Dispense Refill  . Ascorbic Acid (VITAMIN C) 500 MG tablet Take 500 mg by mouth daily.        . B Complex Vitamins (B COMPLEX 1) tablet Take 1 tablet by mouth daily.        . Cholecalciferol (VITAMIN D3) 1000 UNITS tablet Take 1,000 Units by mouth daily.        Marland Kitchen  clemastine (TAVIST) 2.68 MG TABS Take 2.68 mg by mouth as needed.       . mometasone (NASONEX) 50 MCG/ACT nasal spray Place 2 sprays into the nose at bedtime.  17 g  6  . Multiple Vitamins-Minerals (OCUVITE PO) Take by mouth daily.        . Omega-3 Fatty Acids (FISH OIL) 1000 MG CAPS Take 1,000 capsules by mouth daily.        Marland Kitchen triamcinolone cream (KENALOG) 0.1 % Apply 1 application topically 4 (four) times daily.      Marland Kitchen warfarin (COUMADIN) 5 MG tablet TAKE AS DIRECTED BY ANTICOAGULATION CLINIC  135 tablet  1  . [DISCONTINUED] diltiazem (CARDIZEM) 30 MG tablet Take 30 mg by mouth 3 (three) times daily.        No current facility-administered medications on file prior to visit.    BP 100/68  Pulse 60  Temp(Src) 97.7 F (36.5 C)  (Oral)  Ht 5\' 7"  (1.702 m)  Wt 170 lb (77.111 kg)  BMI 26.62 kg/m2  SpO2 97%       Objective:   Physical Exam  Constitutional: He is oriented to person, place, and time. He appears well-developed and well-nourished. No distress.  HENT:  Head: Normocephalic and atraumatic.  Right Ear: External ear normal.  Left Ear: External ear normal.  Patient unable to hear finger rub left ear.  Right ear hearing also grossly impaired.  Eyes: Conjunctivae and EOM are normal. Pupils are equal, round, and reactive to light.  Neck: Neck supple.  No carotid bruit  Cardiovascular: Normal rate, regular rhythm and normal heart sounds.   No murmur heard. Pulmonary/Chest: Effort normal and breath sounds normal. He has no wheezes.  Abdominal: Soft. Bowel sounds are normal. He exhibits no distension and no mass. There is no tenderness.  Genitourinary: Rectum normal, prostate normal and penis normal. Guaiac negative stool.  Musculoskeletal: Normal range of motion. He exhibits no edema.  Lymphadenopathy:    He has no cervical adenopathy.  Neurological: He is alert and oriented to person, place, and time. No cranial nerve deficit.  Skin: Skin is warm and dry.  Psychiatric: He has a normal mood and affect. His behavior is normal. Judgment and thought content normal.          Assessment & Plan:

## 2013-05-30 NOTE — Assessment & Plan Note (Signed)
Well controlled.  No change in medication.  Monitor electrolytes and kidney function.  10 year cardiovascular risk is 31.5 %  Start pravastatin 10 mg once daily.

## 2013-05-30 NOTE — Telephone Encounter (Signed)
Spoke with pt wife, medication confirmed. Will make dr Jens Som aware of meds.

## 2013-05-30 NOTE — Assessment & Plan Note (Signed)
Monitor CBCD. 

## 2013-05-30 NOTE — Assessment & Plan Note (Signed)
Medicare wellness questionnaire reviewed in detail.  Patient exhibits signs of mild cognitive impairment.  Check CT of Head.  Start Aricept 5 mg once daily.  He also has hearing loss.  Patient agrees to get formal hearing test.

## 2013-05-30 NOTE — Assessment & Plan Note (Signed)
Start aricept 5 mg once daily

## 2013-05-30 NOTE — Patient Instructions (Signed)
You can go to [facility] for hearing test Please complete the following lab tests before your next follow up appointment: FLP, LFTs - 272.4

## 2013-05-30 NOTE — Telephone Encounter (Signed)
New message    Pt is on warfarin 5mg  bid; lisinopril 10mg ; finasteride 5mg ; fish oil, vit C, vit B complex, vit B12, centrum silver for men vit, maganesieum, vit D3 and arides #2 for eyes. Wife said she was to call and give a list of patient's medications

## 2013-05-30 NOTE — Assessment & Plan Note (Signed)
Stable.  No hx of falls.  He is tolerating warfarin.  Monitor CBCD

## 2013-05-31 NOTE — Progress Notes (Signed)
Quick Note:  Called and spoke with pt and pt is aware. ______ 

## 2013-06-05 NOTE — Addendum Note (Signed)
Addended by: Alfred Levins D on: 06/05/2013 08:39 AM   Modules accepted: Orders

## 2013-06-07 ENCOUNTER — Ambulatory Visit (INDEPENDENT_AMBULATORY_CARE_PROVIDER_SITE_OTHER): Payer: Medicare Other | Admitting: *Deleted

## 2013-06-07 DIAGNOSIS — Z7901 Long term (current) use of anticoagulants: Secondary | ICD-10-CM

## 2013-06-07 DIAGNOSIS — I4891 Unspecified atrial fibrillation: Secondary | ICD-10-CM

## 2013-06-07 LAB — POCT INR: INR: 2.6

## 2013-06-08 ENCOUNTER — Encounter: Payer: Self-pay | Admitting: Gastroenterology

## 2013-06-18 ENCOUNTER — Other Ambulatory Visit: Payer: Self-pay | Admitting: Cardiology

## 2013-06-18 ENCOUNTER — Encounter: Payer: Self-pay | Admitting: Internal Medicine

## 2013-06-18 ENCOUNTER — Ambulatory Visit (INDEPENDENT_AMBULATORY_CARE_PROVIDER_SITE_OTHER): Payer: Medicare Other | Admitting: Internal Medicine

## 2013-06-18 VITALS — BP 120/62 | HR 67 | Temp 97.3°F | Resp 20 | Wt 172.0 lb

## 2013-06-18 DIAGNOSIS — I4891 Unspecified atrial fibrillation: Secondary | ICD-10-CM

## 2013-06-18 DIAGNOSIS — J310 Chronic rhinitis: Secondary | ICD-10-CM

## 2013-06-18 DIAGNOSIS — I10 Essential (primary) hypertension: Secondary | ICD-10-CM

## 2013-06-18 NOTE — Progress Notes (Signed)
Pre-visit discussion using our clinic review tool. No additional management support is needed unless otherwise documented below in the visit note.  

## 2013-06-18 NOTE — Patient Instructions (Signed)
Acute bronchitis symptoms for less than 10 days are generally not helped by antibiotics.  Take over-the-counter expectorants and cough medications such as  Mucinex DM.  Call if there is no improvement in 5 to 7 days or if he developed worsening cough, fever, or new symptoms, such as shortness of breath or chest pain.    

## 2013-06-18 NOTE — Progress Notes (Signed)
Subjective:    Patient ID: Geoffrey Romero, male    DOB: 06/03/1924, 79 y.o.   MRN: [id number]  HPI Pre-visit discussion using our clinic review tool. No additional management support is needed unless otherwise documented below in the visit note.  79 year old patient who is in today for followup. He is on anticoagulation for atrial for ablation. He has hypertension and history of mild cognitive impairment. He presents with a several day history of cough and hoarseness. He states that he has improved. No other significant complaints today. It appears his family encouraged him to be checked out. No fever chest pain or shortness of breath  Past Medical History  Diagnosis Date  . Hypertension   . Hyperlipidemia   . History of diverticulitis of colon     history of diverticulosis  . PAF (paroxysmal atrial fibrillation)     on flecainide  . Long term current use of anticoagulant     anticoagulant therapy  . BPH (benign prostatic hyperplasia)   . Balanitis     phimosis, preputial adhesions  . CTS (carpal tunnel syndrome)   . History of sinus bradycardia     history of bradycardia on hig doses of Cardizem  . Left bundle branch block     normal Myoview    History   Social History  . Marital Status: Married    Spouse Name: N/A    Number of Children: N/A  . Years of Education: N/A   Occupational History  . Not on file.   Social History Main Topics  . Smoking status: Former Games developer  . Smokeless tobacco: Never Used  . Alcohol Use: No  . Drug Use: No  . Sexual Activity: No   Other Topics Concern  . 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   No tobacco    No alcohol               Past Surgical History  Procedure Laterality Date  . Knee arthroscopy  07/2006    left knee debridement, including partial medial and lateral menisectomy  . Lumbar laminectomy/decompression microdiscectomy  2004    L4-5  . Circumcision  2006  .  Carpal tunnel release  2007/2008    Family History  Problem Relation Age of Onset  . Breast cancer Sister   . Stroke Mother   . Diabetes Mother     Allergies  Allergen Reactions  . Tambocor [Flecainide] Other (See Comments)    Mental Issues.  . Penicillins     REACTION: anaphylaxis    Current Outpatient Prescriptions on File Prior to Visit  Medication Sig Dispense Refill  . Ascorbic Acid (VITAMIN C) 500 MG tablet Take 500 mg by mouth daily.        . B Complex Vitamins (B COMPLEX 1) tablet Take 1 tablet by mouth daily.        . Cholecalciferol (VITAMIN D3) 1000 UNITS tablet Take 1,000 Units by mouth daily.        . clemastine (TAVIST) 2.68 MG TABS Take 2.68 mg by mouth as needed.       . donepezil (ARICEPT) 5 MG tablet Take 1 tablet (5 mg total) by mouth at bedtime.  90 tablet  1  . finasteride (PROSCAR) 5 MG tablet Take 1 tablet (5 mg total) by mouth daily.  90 tablet  1  . lisinopril (PRINIVIL,ZESTRIL) 10 MG tablet Take 1 tablet (10 mg total) by mouth  daily.  90 tablet  1  . Magnesium 400 MG CAPS Take 1 capsule by mouth daily.      . mometasone (NASONEX) 50 MCG/ACT nasal spray Place 2 sprays into the nose at bedtime.  17 g  6  . Multiple Vitamins-Minerals (OCUVITE PO) Take by mouth daily.        . Omega-3 Fatty Acids (FISH OIL) 1000 MG CAPS Take 1,000 capsules by mouth daily.        . pravastatin (PRAVACHOL) 20 MG tablet Take 0.5 tablets (10 mg total) by mouth daily.  90 tablet  1  . triamcinolone cream (KENALOG) 0.1 % Apply 1 application topically 4 (four) times daily.      Marland Kitchen warfarin (COUMADIN) 5 MG tablet TAKE AS DIRECTED BY ANTICOAGULATION CLINIC  135 tablet  1  . [DISCONTINUED] diltiazem (CARDIZEM) 30 MG tablet Take 30 mg by mouth 3 (three) times daily.        No current facility-administered medications on file prior to visit.    BP 120/62  Pulse 67  Temp(Src) 97.3 F (36.3 C) (Oral)  Resp 20  Wt 172 lb (78.019 kg)  SpO2 97%       Review of Systems   Constitutional: Positive for fatigue. Negative for fever, chills and appetite change.  HENT: Positive for voice change. Negative for congestion, dental problem, ear pain, hearing loss, sore throat, tinnitus and trouble swallowing.   Eyes: Negative for pain, discharge and visual disturbance.  Respiratory: Positive for cough. Negative for chest tightness, wheezing and stridor.   Cardiovascular: Negative for chest pain, palpitations and leg swelling.  Gastrointestinal: Negative for nausea, vomiting, abdominal pain, diarrhea, constipation, blood in stool and abdominal distention.  Genitourinary: Negative for urgency, hematuria, flank pain, discharge, difficulty urinating and genital sores.  Musculoskeletal: Negative for arthralgias, back pain, gait problem, joint swelling, myalgias and neck stiffness.  Skin: Negative for rash.  Neurological: Negative for dizziness, syncope, speech difficulty, weakness, numbness and headaches.  Hematological: Negative for adenopathy. Does not bruise/bleed easily.  Psychiatric/Behavioral: Negative for behavioral problems and dysphoric mood. The patient is not nervous/anxious.        Objective:   Physical Exam  Constitutional: He is oriented to person, place, and time. He appears well-developed and well-nourished. No distress.  HENT:  Head: Normocephalic.  Right Ear: External ear normal.  Left Ear: External ear normal.  Eyes: Conjunctivae and EOM are normal.  Neck: Normal range of motion.  Cardiovascular: Normal rate and normal heart sounds.   Pulmonary/Chest: Effort normal and breath sounds normal. No respiratory distress. He has no wheezes. He has no rales.  Abdominal: Bowel sounds are normal.  Musculoskeletal: Normal range of motion. He exhibits no edema and no tenderness.  Neurological: He is alert and oriented to person, place, and time.  Psychiatric: He has a normal mood and affect. His behavior is normal.          Assessment & Plan:  Mild viral  URI with cough. Samples of Mucinex DM dispense Atrial fibrillation stable rhythm appears regular today continue chronic anticoagulation Hypertension well controlled  Follow PCP as scheduled

## 2013-07-05 ENCOUNTER — Ambulatory Visit (INDEPENDENT_AMBULATORY_CARE_PROVIDER_SITE_OTHER): Payer: Medicare Other | Admitting: General Practice

## 2013-07-05 DIAGNOSIS — Z7901 Long term (current) use of anticoagulants: Secondary | ICD-10-CM

## 2013-07-05 DIAGNOSIS — I4891 Unspecified atrial fibrillation: Secondary | ICD-10-CM

## 2013-07-05 LAB — POCT INR: INR: 2.1

## 2013-07-09 ENCOUNTER — Other Ambulatory Visit: Payer: Self-pay | Admitting: Internal Medicine

## 2013-07-09 ENCOUNTER — Telehealth: Payer: Self-pay | Admitting: *Deleted

## 2013-07-09 DIAGNOSIS — R413 Other amnesia: Secondary | ICD-10-CM

## 2013-07-09 NOTE — Telephone Encounter (Signed)
Message copied by Jacqualyn Posey on Mon Jul 09, 2013  1:57 PM ------      Message from: Meda Coffee      Created: Mon Jul 09, 2013 12:07 PM       Call pt - I recommend he have MRI of brain due his memory issues.            RY      ----- Message -----         From: Lyla Son, CMA         Sent: 07/05/2013  12:46 PM           To: Meda Coffee, DO                        ----- Message -----         From: Primus Bravo         Sent: 06/28/2013   9:15 AM           To: Lyla Son, CMA            Patient told Sheri from [facility] imaging that he is unaware of why he needs the MRI that was ordered. He states that he doesn't recall Dr. Artist Pais ordering it, and he doesn't "want anyone messing with his brain". Please assist.        ------

## 2013-07-09 NOTE — Telephone Encounter (Signed)
Pt will call GI to schedule MRI, also pt has not been taking his pravastatin.  He has a lab tomorrow 07/10/13 and appt with Dr Artist Pais on 07/13/13.  Instructed pt to take the pravastatin and rescheduled lab appt for 08/20/13 and appt with Dr Artist Pais for 08/29/13.  Pt verbalized understanding and had no questions

## 2013-07-10 ENCOUNTER — Other Ambulatory Visit: Payer: Medicare Other

## 2013-07-12 ENCOUNTER — Ambulatory Visit: Payer: Medicare Other | Admitting: Internal Medicine

## 2013-07-13 ENCOUNTER — Ambulatory Visit: Payer: Medicare Other | Admitting: Internal Medicine

## 2013-07-20 ENCOUNTER — Telehealth: Payer: Self-pay | Admitting: Internal Medicine

## 2013-07-20 NOTE — Telephone Encounter (Signed)
Pt would like you to call him concerning the order for the mri.  Pt would like to know what this is for. Pt cancelled the MRI that was scheduled for him.

## 2013-07-20 NOTE — Telephone Encounter (Signed)
Pt is refusing MRI.  He seemed very confused.  He does not understand why he needs it.  He does not want to pay for it.  One minute he is talking about his pills hurting his stomach then he states he is not taking those "pills" anymore.  Another minute he is talking about his son and his appt.  I was jumping around in 3 different conversations with patient and kept trying to get back to the MRI.  Between 3 different conversations he is refusing MRI and will discuss it with Dr Artist Pais on his 08/29/13 appt

## 2013-07-23 NOTE — Telephone Encounter (Signed)
I will discuss need for MRI of brain at next office visit.  Ok to defer

## 2013-07-24 ENCOUNTER — Other Ambulatory Visit: Payer: Medicare Other

## 2013-07-30 ENCOUNTER — Telehealth: Payer: Self-pay | Admitting: *Deleted

## 2013-07-30 DIAGNOSIS — R413 Other amnesia: Secondary | ICD-10-CM

## 2013-07-30 NOTE — Telephone Encounter (Signed)
Order changed.

## 2013-07-30 NOTE — Telephone Encounter (Signed)
Message copied by Pearletha Forge on Mon Jul 30, 2013 12:25 PM ------      Message from: Theadore Nan      Created: Mon Jul 30, 2013  7:59 AM       Please place order per Dr. Shawna Orleans, and let me know when its done so I can schedule. Thank you!       ----- Message -----         From: Rosine Abe, DO         Sent: 07/27/2013   4:13 PM           To: Theadore Nan            Please cancel MRI of Brain.  Change to CT of Brain w/o contrast re: memory loss       ------

## 2013-08-02 ENCOUNTER — Ambulatory Visit (INDEPENDENT_AMBULATORY_CARE_PROVIDER_SITE_OTHER): Payer: Medicare Other

## 2013-08-02 DIAGNOSIS — I4891 Unspecified atrial fibrillation: Secondary | ICD-10-CM

## 2013-08-02 DIAGNOSIS — Z7901 Long term (current) use of anticoagulants: Secondary | ICD-10-CM

## 2013-08-02 LAB — POCT INR: INR: 2.3

## 2013-08-07 ENCOUNTER — Inpatient Hospital Stay: Admission: RE | Admit: 2013-08-07 | Payer: Medicare Other | Source: Ambulatory Visit

## 2013-08-07 ENCOUNTER — Ambulatory Visit: Admission: RE | Admit: 2013-08-07 | Payer: Medicare Other | Source: Ambulatory Visit

## 2013-08-08 ENCOUNTER — Ambulatory Visit (INDEPENDENT_AMBULATORY_CARE_PROVIDER_SITE_OTHER)
Admission: RE | Admit: 2013-08-08 | Discharge: 2013-08-08 | Disposition: A | Discharge status: Home / Self Care | Payer: Medicare Other | Source: Ambulatory Visit | Attending: Internal Medicine | Admitting: Internal Medicine

## 2013-08-08 DIAGNOSIS — R413 Other amnesia: Secondary | ICD-10-CM

## 2013-08-09 ENCOUNTER — Telehealth: Payer: Self-pay | Admitting: Internal Medicine

## 2013-08-09 NOTE — Telephone Encounter (Signed)
Message copied by Lennox Solders on Thu Aug 09, 2013  3:16 PM ------      Message from: Rosine Abe      Created: Thu Aug 09, 2013  9:48 AM       Discussed results with patient.  Patient unclear about his medication list.  I suggest he make an appt next week to perform medication reconciliation and to further discuss recommendations for memory loss.  Patient advised to bring all medications with him to next appt ( please have scheduler remind pt to bring in all of his medications ) ------

## 2013-08-09 NOTE — Telephone Encounter (Signed)
lmom for pt to call back ask for norma

## 2013-08-10 NOTE — Telephone Encounter (Signed)
Pt has been sch and reminded to bring meds

## 2013-08-15 ENCOUNTER — Ambulatory Visit (INDEPENDENT_AMBULATORY_CARE_PROVIDER_SITE_OTHER): Payer: Medicare Other | Admitting: Internal Medicine

## 2013-08-15 ENCOUNTER — Encounter: Payer: Self-pay | Admitting: Internal Medicine

## 2013-08-15 VITALS — BP 118/68 | Temp 98.6°F | Ht 67.0 in | Wt 169.0 lb

## 2013-08-15 DIAGNOSIS — G3184 Mild cognitive impairment, so stated: Secondary | ICD-10-CM

## 2013-08-15 DIAGNOSIS — R413 Other amnesia: Secondary | ICD-10-CM

## 2013-08-15 MED ORDER — DONEPEZIL HCL 10 MG PO TABS: 10.0000 mg | ORAL_TABLET | Freq: Every day | ORAL | Status: DC

## 2013-08-15 NOTE — Progress Notes (Signed)
Pre visit review using our clinic review tool, if applicable. No additional management support is needed unless otherwise documented below in the visit note. 

## 2013-08-15 NOTE — Assessment & Plan Note (Signed)
CT of brain - The brain shows generalized atrophy. 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 There is no sign of acute infarction, mass lesion, hemorrhage,  hydrocephalus or extra-axial collection. Calvarium is unremarkable. Sinuses, middle ears and mastoids are clear.   I doubt patient's memory loss secondary to multi-infarct dementia. He likely has mild Alzheimer's disease. Increase Aricept to 10 mg. Monitor Mini-Mental Status exam yearly. Consider adding Namenda if patient experiences further decline in memory.

## 2013-08-15 NOTE — Progress Notes (Signed)
Subjective:    Patient ID: Geoffrey Romero, male    DOB: Sep 26, 1923, 80 y.o.   MRN: [id number]  HPI  80 year old white male previously seen for memory loss for followup. Patient completed CT of brain.  It showed:  The brain shows generalized atrophy. There are old infarctions  affecting the left temporal cough posterior frontal and parietal  region which have progressed to atrophy and encephalomalacia. There is no sign of acute infarction, mass lesion, hemorrhage,  hydrocephalus or extra-axial collection. Calvarium is unremarkable.  Sinuses, middle ears and mastoids are clear.   Patient is tolerating Aricept 5 mg once daily without difficulty. He denies any loose stools.  Patient was advised to bring in all of his medications for medication reconciliation.  Review of Systems No previous hx of stroke    Past Medical History  Diagnosis Date  . Hypertension   . Hyperlipidemia   . History of diverticulitis of colon     history of diverticulosis  . PAF (paroxysmal atrial fibrillation)     on flecainide  . Long term current use of anticoagulant     anticoagulant therapy  . BPH (benign prostatic hyperplasia)   . Balanitis     phimosis, preputial adhesions  . CTS (carpal tunnel syndrome)   . History of sinus bradycardia     history of bradycardia on hig doses of Cardizem  . Left bundle branch block     normal Myoview    History   Social History  . Marital Status: Married    Spouse Name: N/A    Number of Children: N/A  . Years of Education: N/A   Occupational History  . Not on file.   Social History Main Topics  . Smoking status: Former Research scientist (life sciences)  . Smokeless tobacco: Never Used  . Alcohol Use: No  . Drug Use: No  . Sexual Activity: No   Other Topics Concern  . 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   No tobacco    No alcohol               Past Surgical History  Procedure Laterality Date  . Knee arthroscopy   07/2006    left knee debridement, including partial medial and lateral menisectomy  . Lumbar laminectomy/decompression microdiscectomy  2004    L4-5  . Circumcision  2006  . Carpal tunnel release  2007/2008    Family History  Problem Relation Age of Onset  . Breast cancer Sister   . Stroke Mother   . Diabetes Mother     Allergies  Allergen Reactions  . Tambocor [Flecainide] Other (See Comments)    Mental Issues.  . Penicillins     REACTION: anaphylaxis    Current Outpatient Prescriptions on File Prior to Visit  Medication Sig Dispense Refill  . Cholecalciferol (VITAMIN D3) 1000 UNITS tablet Take 1,000 Units by mouth daily.        . finasteride (PROSCAR) 5 MG tablet Take 1 tablet (5 mg total) by mouth daily.  90 tablet  1  . lisinopril (PRINIVIL,ZESTRIL) 10 MG tablet Take 1 tablet (10 mg total) by mouth daily.  90 tablet  1  . Multiple Vitamins-Minerals (OCUVITE PO) Take by mouth daily.        . Omega-3 Fatty Acids (FISH OIL) 1000 MG CAPS Take 1,000 capsules by mouth daily.        . pravastatin (PRAVACHOL) 20 MG tablet Take 0.5  tablets (10 mg total) by mouth daily.  90 tablet  1  . triamcinolone cream (KENALOG) 0.1 % Apply 1 application topically 4 (four) times daily.      Marland Kitchen warfarin (COUMADIN) 5 MG tablet TAKE AS DIRECTED BY ANTICOAGULATION CLINIC  135 tablet  1  . mometasone (NASONEX) 50 MCG/ACT nasal spray Place 2 sprays into the nose at bedtime.  17 g  6  . [DISCONTINUED] diltiazem (CARDIZEM) 30 MG tablet Take 30 mg by mouth 3 (three) times daily.        No current facility-administered medications on file prior to visit.    BP 118/68  Temp(Src) 98.6 F (37 C) (Oral)  Ht 5\' 7"  (1.702 m)  Wt 169 lb (76.658 kg)  BMI 26.46 kg/m2    Objective:   Physical Exam  Constitutional: He is oriented to person, place, and time. He appears well-developed and well-nourished. No distress.  HENT:  Head: Normocephalic and atraumatic.  Cardiovascular: Normal rate, regular rhythm  and normal heart sounds.   No murmur heard. Pulmonary/Chest: Effort normal and breath sounds normal. He has no wheezes.  Musculoskeletal: He exhibits no edema.  Neurological: He is alert and oriented to person, place, and time. No cranial nerve deficit.  Psychiatric: He has a normal mood and affect. His behavior is normal.          Assessment & Plan:

## 2013-08-17 ENCOUNTER — Telehealth: Payer: Self-pay | Admitting: *Deleted

## 2013-08-17 NOTE — Telephone Encounter (Signed)
pts wife will review with husband

## 2013-08-17 NOTE — Telephone Encounter (Signed)
Message copied by Pearletha Forge on Fri Aug 17, 2013 12:28 PM ------      Message from: Alyson Ingles R      Created: Wed Aug 15, 2013 11:00 PM       Call pt's wife and make sure she reviews and helps her husband with medication changes.  I suggest he use pill box if he not already doing so. ------

## 2013-08-20 ENCOUNTER — Other Ambulatory Visit (INDEPENDENT_AMBULATORY_CARE_PROVIDER_SITE_OTHER): Payer: Medicare Other

## 2013-08-20 DIAGNOSIS — E785 Hyperlipidemia, unspecified: Secondary | ICD-10-CM

## 2013-08-20 LAB — LIPID PANEL
Cholesterol: 159 mg/dL (ref 0–200)
HDL: 37.3 mg/dL — ABNORMAL LOW (ref >39.00)
LDL Cholesterol: 107 mg/dL — ABNORMAL HIGH (ref 0–99)
Total CHOL/HDL Ratio: 4
Triglycerides: 75 mg/dL (ref 0.0–149.0)
VLDL: 15 mg/dL (ref 0.0–40.0)

## 2013-08-20 LAB — HEPATIC FUNCTION PANEL
ALBUMIN: 4.3 g/dL (ref 3.5–5.2)
ALT: 17 U/L (ref 0–53)
AST: 26 U/L (ref 0–37)
Alkaline Phosphatase: 52 U/L (ref 39–117)
Bilirubin, Direct: 0.1 mg/dL (ref 0.0–0.3)
Total Bilirubin: 0.7 mg/dL (ref 0.3–1.2)
Total Protein: 7.3 g/dL (ref 6.0–8.3)

## 2013-08-29 ENCOUNTER — Ambulatory Visit (INDEPENDENT_AMBULATORY_CARE_PROVIDER_SITE_OTHER): Payer: Medicare Other | Admitting: Internal Medicine

## 2013-08-29 ENCOUNTER — Encounter: Payer: Self-pay | Admitting: Internal Medicine

## 2013-08-29 VITALS — BP 124/50 | HR 72 | Temp 98.2°F | Ht 67.0 in | Wt 168.0 lb

## 2013-08-29 DIAGNOSIS — F0390 Unspecified dementia without behavioral disturbance: Secondary | ICD-10-CM

## 2013-08-29 DIAGNOSIS — E785 Hyperlipidemia, unspecified: Secondary | ICD-10-CM

## 2013-08-29 DIAGNOSIS — I10 Essential (primary) hypertension: Secondary | ICD-10-CM

## 2013-08-29 NOTE — Progress Notes (Signed)
Pre visit review using our clinic review tool, if applicable. No additional management support is needed unless otherwise documented below in the visit note. 

## 2013-08-29 NOTE — Assessment & Plan Note (Signed)
Patient tolerating pravastatin.  LFTs normal.

## 2013-08-29 NOTE — Assessment & Plan Note (Signed)
Patient with mild dementia.  Take Aricept 10 mg in the morning.

## 2013-08-29 NOTE — Patient Instructions (Signed)
Monitor your blood pressure at home once weekly and keep a log Contact our office if your systolic blood pressure is less than 110 or you experience dizziness

## 2013-08-29 NOTE — Progress Notes (Signed)
Subjective:    Patient ID: Geoffrey Romero, male    DOB: April 01, 1924, 80 y.o.   MRN: [id number]  HPI  80 year old white male with history of hypertension, A. Fib and mild dementia for followup. Patient currently taking Aricept 10 mg at bedtime. Patient reports medication is disrupting sleep.  Otherwise patient is tolerating Aricept without side effects. He denies any diarrhea.  Hypertension-stable. He denies dizziness or lightheadedness.  Hyperlipidemia - He is tolerating pravastatin.  His liver function tests are stable.  Review of Systems Negative for chest pain    Past Medical History  Diagnosis Date  . Hypertension   . Hyperlipidemia   . History of diverticulitis of colon     history of diverticulosis  . PAF (paroxysmal atrial fibrillation)     on flecainide  . Long term current use of anticoagulant     anticoagulant therapy  . BPH (benign prostatic hyperplasia)   . Balanitis     phimosis, preputial adhesions  . CTS (carpal tunnel syndrome)   . History of sinus bradycardia     history of bradycardia on hig doses of Cardizem  . Left bundle branch block     normal Myoview    History   Social History  . Marital Status: Married    Spouse Name: N/A    Number of Children: N/A  . Years of Education: N/A   Occupational History  . Not on file.   Social History Main Topics  . Smoking status: Former Research scientist (life sciences)  . Smokeless tobacco: Never Used  . Alcohol Use: No  . Drug Use: No  . Sexual Activity: No   Other Topics Concern  . 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   No tobacco    No alcohol               Past Surgical History  Procedure Laterality Date  . Knee arthroscopy  07/2006    left knee debridement, including partial medial and lateral menisectomy  . Lumbar laminectomy/decompression microdiscectomy  2004    L4-5  . Circumcision  2006  . Carpal tunnel release  2007/2008    Family History  Problem  Relation Age of Onset  . Breast cancer Sister   . Stroke Mother   . Diabetes Mother     Allergies  Allergen Reactions  . Tambocor [Flecainide] Other (See Comments)    Mental Issues.  . Penicillins     REACTION: anaphylaxis    Current Outpatient Prescriptions on File Prior to Visit  Medication Sig Dispense Refill  . Cholecalciferol (VITAMIN D3) 1000 UNITS tablet Take 1,000 Units by mouth daily.        . finasteride (PROSCAR) 5 MG tablet Take 1 tablet (5 mg total) by mouth daily.  90 tablet  1  . lisinopril (PRINIVIL,ZESTRIL) 10 MG tablet Take 1 tablet (10 mg total) by mouth daily.  90 tablet  1  . mometasone (NASONEX) 50 MCG/ACT nasal spray Place 2 sprays into the nose at bedtime.  17 g  6  . Multiple Vitamins-Minerals (OCUVITE PO) Take by mouth daily.        . Omega-3 Fatty Acids (FISH OIL) 1000 MG CAPS Take 1,000 capsules by mouth daily.        . pravastatin (PRAVACHOL) 20 MG tablet Take 0.5 tablets (10 mg total) by mouth daily.  90 tablet  1  . triamcinolone cream (KENALOG) 0.1 % Apply 1 application  topically 4 (four) times daily.      Marland Kitchen warfarin (COUMADIN) 5 MG tablet TAKE AS DIRECTED BY ANTICOAGULATION CLINIC  135 tablet  1  . [DISCONTINUED] diltiazem (CARDIZEM) 30 MG tablet Take 30 mg by mouth 3 (three) times daily.        No current facility-administered medications on file prior to visit.    BP 124/50  Pulse 72  Temp(Src) 98.2 F (36.8 C) (Oral)  Ht 5\' 7"  (1.702 m)  Wt 168 lb (76.204 kg)  BMI 26.31 kg/m2    Objective:   Physical Exam  Constitutional: He is oriented to person, place, and time. He appears well-developed and well-nourished. No distress.  Cardiovascular: Normal rate, regular rhythm and normal heart sounds.   Pulmonary/Chest: Effort normal and breath sounds normal. He has no wheezes.  Neurological: He is alert and oriented to person, place, and time. No cranial nerve deficit.  Psychiatric: He has a normal mood and affect. His behavior is normal.           Assessment & Plan:

## 2013-08-29 NOTE — Assessment & Plan Note (Signed)
BP is low normal.  Patient advised to monitor blood pressure at home. If systolic pressure less than 110, patient instructed to take 5 mg of lisinopril. BP: 124/50 mmHg

## 2013-08-31 ENCOUNTER — Telehealth: Payer: Self-pay | Admitting: Internal Medicine

## 2013-08-31 NOTE — Telephone Encounter (Signed)
Relevant patient education mailed to patient.  

## 2013-09-21 ENCOUNTER — Ambulatory Visit (INDEPENDENT_AMBULATORY_CARE_PROVIDER_SITE_OTHER): Payer: Medicare Other | Admitting: *Deleted

## 2013-09-21 DIAGNOSIS — Z5181 Encounter for therapeutic drug level monitoring: Secondary | ICD-10-CM

## 2013-09-21 DIAGNOSIS — Z7901 Long term (current) use of anticoagulants: Secondary | ICD-10-CM

## 2013-09-21 DIAGNOSIS — I4891 Unspecified atrial fibrillation: Secondary | ICD-10-CM

## 2013-09-21 LAB — POCT INR: INR: 2.5

## 2013-10-29 ENCOUNTER — Ambulatory Visit (INDEPENDENT_AMBULATORY_CARE_PROVIDER_SITE_OTHER): Payer: Medicare Other | Admitting: Family Medicine

## 2013-10-29 ENCOUNTER — Encounter: Payer: Self-pay | Admitting: Family Medicine

## 2013-10-29 VITALS — BP 130/64 | HR 80 | Temp 99.4°F | Wt 169.0 lb

## 2013-10-29 DIAGNOSIS — J069 Acute upper respiratory infection, unspecified: Secondary | ICD-10-CM

## 2013-10-29 NOTE — Progress Notes (Signed)
Pre visit review using our clinic review tool, if applicable. No additional management support is needed unless otherwise documented below in the visit note. 

## 2013-10-29 NOTE — Patient Instructions (Signed)
INSTRUCTIONS FOR UPPER RESPIRATORY INFECTION:  -plenty of rest and fluids  -nasal saline wash 2-3 times daily (use prepackaged nasal saline or bottled/distilled water if making your own)   -clean nose with nasal saline before using the nasal steroid or sinex  -can use sinex OR afrin nasal spray for drainage and nasal congestion - but do NOT use longer then 3-4 days  -can use tylenol 500mg  up to 3 times per day as directed for aches and sore throat if needed  -in the winter time, using a humidifier at night is helpful (please follow cleaning instructions)  -if you are taking a cough medication - use only as directed, may also try a teaspoon of honey to coat the throat and throat lozenges  -for sore throat, salt water gargles can help  -follow up if you have fevers, facial pain, tooth pain, difficulty breathing or are worsening or not getting better in 5-7 days

## 2013-10-29 NOTE — Progress Notes (Signed)
Chief Complaint  Patient presents with  . Sore Throat    cough, congestion    HPI:  -started: 2 days ago -symptoms:nasal congestion, sore throat, cough -denies:fever, SOB, NVD, tooth pain, body aches -has tried: nothing -sick contacts/travel/risks: denies flu exposure or Ebola risks  ROS: See pertinent positives and negatives per HPI.  Past Medical History  Diagnosis Date  . Hypertension   . Hyperlipidemia   . History of diverticulitis of colon     history of diverticulosis  . PAF (paroxysmal atrial fibrillation)     on flecainide  . Long term current use of anticoagulant     anticoagulant therapy  . BPH (benign prostatic hyperplasia)   . Balanitis     phimosis, preputial adhesions  . CTS (carpal tunnel syndrome)   . History of sinus bradycardia     history of bradycardia on hig doses of Cardizem  . Left bundle branch block     normal Myoview    Past Surgical History  Procedure Laterality Date  . Knee arthroscopy  07/2006    left knee debridement, including partial medial and lateral menisectomy  . Lumbar laminectomy/decompression microdiscectomy  2004    L4-5  . Circumcision  2006  . Carpal tunnel release  2007/2008    Family History  Problem Relation Age of Onset  . Breast cancer Sister   . Stroke Mother   . Diabetes Mother     History   Social History  . Marital Status: Married    Spouse Name: N/A    Number of Children: N/A  . Years of Education: N/A   Social History Main Topics  . Smoking status: Former Research scientist (life sciences)  . Smokeless tobacco: Never Used  . Alcohol Use: No  . Drug Use: No  . Sexual Activity: No   Other Topics Concern  . 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   No tobacco    No alcohol               Current outpatient prescriptions:Cholecalciferol (VITAMIN D3) 1000 UNITS tablet, Take 1,000 Units by mouth daily.  , Disp: , Rfl: ;  donepezil (ARICEPT) 10 MG tablet, Take 1 tablet (10 mg total)  by mouth daily., Disp: 90 tablet, Rfl: 1;  finasteride (PROSCAR) 5 MG tablet, Take 1 tablet (5 mg total) by mouth daily., Disp: 90 tablet, Rfl: 1 lisinopril (PRINIVIL,ZESTRIL) 10 MG tablet, Take 1 tablet (10 mg total) by mouth daily., Disp: 90 tablet, Rfl: 1;  mometasone (NASONEX) 50 MCG/ACT nasal spray, Place 2 sprays into the nose at bedtime., Disp: 17 g, Rfl: 6;  Multiple Vitamins-Minerals (OCUVITE PO), Take by mouth daily.  , Disp: , Rfl: ;  Omega-3 Fatty Acids (FISH OIL) 1000 MG CAPS, Take 1,000 capsules by mouth daily.  , Disp: , Rfl:  pravastatin (PRAVACHOL) 20 MG tablet, Take 0.5 tablets (10 mg total) by mouth daily., Disp: 90 tablet, Rfl: 1;  triamcinolone cream (KENALOG) 0.1 %, Apply 1 application topically 4 (four) times daily., Disp: , Rfl: ;  warfarin (COUMADIN) 5 MG tablet, TAKE AS DIRECTED BY ANTICOAGULATION CLINIC, Disp: 135 tablet, Rfl: 1;  [DISCONTINUED] diltiazem (CARDIZEM) 30 MG tablet, Take 30 mg by mouth 3 (three) times daily. , Disp: , Rfl:   EXAM:  Filed Vitals:   10/29/13 1328  BP: 130/64  Pulse: 80  Temp: 99.4 F (37.4 C)    Body mass index is 26.46 kg/(m^2).  GENERAL: vitals reviewed and  listed above, alert, oriented, appears well hydrated and in no acute distress  HEENT: atraumatic, conjunttiva clear, no obvious abnormalities on inspection of external nose and ears, normal appearance of ear canals and TMs, clear nasal congestion, mild post oropharyngeal erythema with PND, no tonsillar edema or exudate, no sinus TTP  NECK: no obvious masses on inspection  LUNGS: clear to auscultation bilaterally, no wheezes, rales or rhonchi, good air movement  CV: HRRR, no peripheral edema  MS: moves all extremities without noticeable abnormality  PSYCH: pleasant and cooperative, no obvious depression or anxiety  ASSESSMENT AND PLAN:  Discussed the following assessment and plan:  Upper respiratory infection  -given HPI and exam findings today, a serious infection or  illness is unlikely. We discussed potential etiologies, with VURI being most likely, and advised supportive care and monitoring. We discussed treatment side effects, likely course, antibiotic misuse, transmission, and signs of developing a serious illness. -of course, we advised to return or notify a doctor immediately if symptoms worsen or persist or new concerns arise.    Patient Instructions  INSTRUCTIONS FOR UPPER RESPIRATORY INFECTION:  -plenty of rest and fluids  -nasal saline wash 2-3 times daily (use prepackaged nasal saline or bottled/distilled water if making your own)   -clean nose with nasal saline before using the nasal steroid or sinex  -can use sinex OR afrin nasal spray for drainage and nasal congestion - but do NOT use longer then 3-4 days  -can use tylenol 500mg  up to 3 times per day as directed for aches and sore throat if needed  -in the winter time, using a humidifier at night is helpful (please follow cleaning instructions)  -if you are taking a cough medication - use only as directed, may also try a teaspoon of honey to coat the throat and throat lozenges  -for sore throat, salt water gargles can help  -follow up if you have fevers, facial pain, tooth pain, difficulty breathing or are worsening or not getting better in 5-7 days      Xaivier Malay R.

## 2013-11-09 ENCOUNTER — Ambulatory Visit (INDEPENDENT_AMBULATORY_CARE_PROVIDER_SITE_OTHER): Payer: Medicare Other | Admitting: Pharmacist

## 2013-11-09 DIAGNOSIS — Z5181 Encounter for therapeutic drug level monitoring: Secondary | ICD-10-CM

## 2013-11-09 DIAGNOSIS — Z7901 Long term (current) use of anticoagulants: Secondary | ICD-10-CM

## 2013-11-09 DIAGNOSIS — I4891 Unspecified atrial fibrillation: Secondary | ICD-10-CM

## 2013-11-09 LAB — POCT INR: INR: 2.4

## 2013-11-29 ENCOUNTER — Ambulatory Visit (INDEPENDENT_AMBULATORY_CARE_PROVIDER_SITE_OTHER): Payer: Medicare Other | Admitting: Cardiology

## 2013-11-29 ENCOUNTER — Encounter: Payer: Self-pay | Admitting: Cardiology

## 2013-11-29 VITALS — BP 138/60 | HR 59 | Ht 67.0 in | Wt 173.0 lb

## 2013-11-29 DIAGNOSIS — I4891 Unspecified atrial fibrillation: Secondary | ICD-10-CM

## 2013-11-29 DIAGNOSIS — E785 Hyperlipidemia, unspecified: Secondary | ICD-10-CM

## 2013-11-29 DIAGNOSIS — I10 Essential (primary) hypertension: Secondary | ICD-10-CM

## 2013-11-29 NOTE — Assessment & Plan Note (Signed)
Patient remains in sinus rhythm. Continue Coumadin. If he has more frequent episodes in the future we will consider a different antiarrhythmic such as amiodarone. Is mild dementia but no history of falls. We will follow this closely and discontinue anticoagulating this risk begins to outweigh the benefit.

## 2013-11-29 NOTE — Assessment & Plan Note (Signed)
Blood pressure controlled. Continue present medications. 

## 2013-11-29 NOTE — Assessment & Plan Note (Signed)
Continue statin. 

## 2013-11-29 NOTE — Patient Instructions (Signed)
Your physician wants you to follow-up in: ONE YEAR WITH DR CRENSHAW You will receive a reminder letter in the mail two months in advance. If you don't receive a letter, please call our office to schedule the follow-up appointment.  

## 2013-11-29 NOTE — Progress Notes (Signed)
HPI: FU paroxysmal atrial fibrillation. Monitor in Jan 2013 showed PAF. TSH normal. Lexiscan Myoview in March of 2013 showed an ejection fraction of 54% and normal perfusion. Patient seen in June of 2013 for syncope. Patient's flecainide was discontinued. Head CT showed no skull fracture. There was a right frontal extra-axial process suggesting a vessel rather than a subdural hematoma. Event monitor showed sinus rhythm with PACs. It was felt his syncopal episode may have been related to dehydration. Last echocardiogram in May 2014 showed normal LV function, moderate mitral regurgitation. I last saw him in Novf of 2014. Since then, the patient denies any dyspnea on exertion, orthopnea, PND, pedal edema, palpitations, syncope or chest pain.    Current Outpatient Prescriptions  Medication Sig Dispense Refill  . donepezil (ARICEPT) 10 MG tablet Take 1 tablet (10 mg total) by mouth daily.  90 tablet  1  . finasteride (PROSCAR) 5 MG tablet Take 1 tablet (5 mg total) by mouth daily.  90 tablet  1  . lisinopril (PRINIVIL,ZESTRIL) 10 MG tablet Take 1 tablet (10 mg total) by mouth daily.  90 tablet  1  . pravastatin (PRAVACHOL) 20 MG tablet Take 0.5 tablets (10 mg total) by mouth daily.  90 tablet  1  . warfarin (COUMADIN) 5 MG tablet TAKE AS DIRECTED BY ANTICOAGULATION CLINIC  135 tablet  1  . [DISCONTINUED] diltiazem (CARDIZEM) 30 MG tablet Take 30 mg by mouth 3 (three) times daily.        No current facility-administered medications for this visit.     Past Medical History  Diagnosis Date  . Hypertension   . Hyperlipidemia   . History of diverticulitis of colon     history of diverticulosis  . PAF (paroxysmal atrial fibrillation)     on flecainide  . Long term current use of anticoagulant     anticoagulant therapy  . BPH (benign prostatic hyperplasia)   . Balanitis     phimosis, preputial adhesions  . CTS (carpal tunnel syndrome)   . History of sinus bradycardia     history of  bradycardia on hig doses of Cardizem  . Left bundle branch block     normal Myoview    Past Surgical History  Procedure Laterality Date  . Knee arthroscopy  07/2006    left knee debridement, including partial medial and lateral menisectomy  . Lumbar laminectomy/decompression microdiscectomy  2004    L4-5  . Circumcision  2006  . Carpal tunnel release  2007/2008    History   Social History  . Marital Status: Married    Spouse Name: N/A    Number of Children: N/A  . Years of Education: N/A   Occupational History  . Not on file.   Social History Main Topics  . Smoking status: Former Research scientist (life sciences)  . Smokeless tobacco: Never Used  . Alcohol Use: No  . Drug Use: No  . Sexual Activity: No   Other Topics Concern  . 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   No tobacco    No alcohol               ROS: no fevers or chills, productive cough, hemoptysis, dysphasia, odynophagia, melena, hematochezia, dysuria, hematuria, rash, seizure activity, orthopnea, PND, pedal edema, claudication. Remaining systems are negative.  Physical Exam: Well-developed well-nourished in no acute distress.  Skin is warm and dry.  HEENT is normal.  Neck is  supple.  Chest is clear to auscultation with normal expansion.  Cardiovascular exam is regular rate and rhythm.  Abdominal exam nontender or distended. No masses palpated. Extremities show no edema. neuro grossly intact  ECG Sinus rhythm and at a rate of 59. Left bundle branch block.

## 2013-12-18 ENCOUNTER — Other Ambulatory Visit: Payer: Self-pay | Admitting: Cardiology

## 2014-02-27 ENCOUNTER — Ambulatory Visit (INDEPENDENT_AMBULATORY_CARE_PROVIDER_SITE_OTHER): Payer: Medicare Other | Admitting: *Deleted

## 2014-02-27 DIAGNOSIS — Z7901 Long term (current) use of anticoagulants: Secondary | ICD-10-CM

## 2014-02-27 DIAGNOSIS — I4891 Unspecified atrial fibrillation: Secondary | ICD-10-CM

## 2014-02-27 DIAGNOSIS — Z5181 Encounter for therapeutic drug level monitoring: Secondary | ICD-10-CM

## 2014-02-27 LAB — POCT INR: INR: 2

## 2014-03-21 ENCOUNTER — Other Ambulatory Visit: Payer: Self-pay | Admitting: Internal Medicine

## 2014-04-09 ENCOUNTER — Other Ambulatory Visit: Payer: Self-pay | Admitting: Internal Medicine

## 2014-04-10 ENCOUNTER — Ambulatory Visit (INDEPENDENT_AMBULATORY_CARE_PROVIDER_SITE_OTHER): Payer: Medicare Other | Admitting: *Deleted

## 2014-04-10 DIAGNOSIS — I4891 Unspecified atrial fibrillation: Secondary | ICD-10-CM

## 2014-04-10 DIAGNOSIS — Z5181 Encounter for therapeutic drug level monitoring: Secondary | ICD-10-CM

## 2014-04-10 DIAGNOSIS — Z7901 Long term (current) use of anticoagulants: Secondary | ICD-10-CM

## 2014-04-10 LAB — POCT INR: INR: 1.3

## 2014-04-19 ENCOUNTER — Ambulatory Visit (INDEPENDENT_AMBULATORY_CARE_PROVIDER_SITE_OTHER): Payer: Medicare Other

## 2014-04-19 DIAGNOSIS — Z7901 Long term (current) use of anticoagulants: Secondary | ICD-10-CM

## 2014-04-19 DIAGNOSIS — I4891 Unspecified atrial fibrillation: Secondary | ICD-10-CM

## 2014-04-19 DIAGNOSIS — Z5181 Encounter for therapeutic drug level monitoring: Secondary | ICD-10-CM

## 2014-04-19 LAB — POCT INR: INR: 2

## 2014-05-08 ENCOUNTER — Encounter (HOSPITAL_COMMUNITY): Payer: Self-pay | Admitting: Emergency Medicine

## 2014-05-08 ENCOUNTER — Emergency Department (INDEPENDENT_AMBULATORY_CARE_PROVIDER_SITE_OTHER)
Admission: EM | Admit: 2014-05-08 | Discharge: 2014-05-08 | Disposition: A | Discharge status: Home / Self Care | Payer: Medicare Other | Source: Home / Self Care | Attending: Emergency Medicine | Admitting: Emergency Medicine

## 2014-05-08 DIAGNOSIS — Z23 Encounter for immunization: Secondary | ICD-10-CM

## 2014-05-08 DIAGNOSIS — S51801A Unspecified open wound of right forearm, initial encounter: Secondary | ICD-10-CM

## 2014-05-08 DIAGNOSIS — S51811A Laceration without foreign body of right forearm, initial encounter: Secondary | ICD-10-CM

## 2014-05-08 MED ORDER — TETANUS-DIPHTH-ACELL PERTUSSIS 5-2.5-18.5 LF-MCG/0.5 IM SUSP
INTRAMUSCULAR | Status: AC
Start: 2014-05-08 — End: 2014-05-08
  Filled 2014-05-08: qty 0.5

## 2014-05-08 MED ORDER — TETANUS-DIPHTH-ACELL PERTUSSIS 5-2.5-18.5 LF-MCG/0.5 IM SUSP
0.5000 mL | Freq: Once | INTRAMUSCULAR | Status: AC
  Administered 2014-05-08: 0.5 mL via INTRAMUSCULAR

## 2014-05-08 NOTE — ED Provider Notes (Signed)
CSN: [id number]     Arrival date & time 05/08/14  1153 History   First MD Initiated Contact with Patient 05/08/14 1315     Chief Complaint  Patient presents with  . 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 Reports area has been oozing blood since incident and wife asked him to come to [facility] for evaluation. Patient takes Coumadin daily.   The history is provided by the patient.    Past Medical History  Diagnosis Date  . Hypertension   . Hyperlipidemia   . History of diverticulitis of colon     history of diverticulosis  . PAF (paroxysmal atrial fibrillation)     on flecainide  . Long term current use of anticoagulant     anticoagulant therapy  . BPH (benign prostatic hyperplasia)   . Balanitis     phimosis, preputial adhesions  . CTS (carpal tunnel syndrome)   . History of sinus bradycardia     history of bradycardia on hig doses of Cardizem  . Left bundle branch block     normal Myoview   Past Surgical History  Procedure Laterality Date  . Knee arthroscopy  07/2006    left knee debridement, including partial medial and lateral menisectomy  . Lumbar laminectomy/decompression microdiscectomy  2004    L4-5  . Circumcision  2006  . Carpal tunnel release  2007/2008   Family History  Problem Relation Age of Onset  . Breast cancer Sister   . Stroke Mother   . Diabetes Mother    History  Substance Use Topics  . Smoking status: Former Research scientist (life sciences)  . Smokeless tobacco: Never Used  . Alcohol Use: No    Review of Systems  All other systems reviewed and are negative.   Allergies  Tambocor and Penicillins  Home Medications   Prior to Admission medications   Medication Sig Start Date End Date Taking? Authorizing Provider  donepezil (ARICEPT) 10 MG tablet  TAKE ONE TABLET BY MOUTH EVERY NIGHT AT BEDTIME 03/22/14   Doe-Hyun R Shawna Orleans, DO  finasteride (PROSCAR) 5 MG tablet Take 1 tablet (5 mg total) by mouth daily. 05/30/13   Doe-Hyun R Shawna Orleans, DO  lisinopril (PRINIVIL,ZESTRIL) 10 MG tablet TAKE ONE (1) TABLET BY MOUTH EVERY DAY 04/09/14   Doe-Hyun R Shawna Orleans, DO  pravastatin (PRAVACHOL) 20 MG tablet Take 0.5 tablets (10 mg total) by mouth daily. 05/30/13   Doe-Hyun R Shawna Orleans, DO  warfarin (COUMADIN) 5 MG tablet TAKE AS DIRECTED BY ANTICOAGULATION CLINIC 12/18/13   Lelon Perla, MD   BP 146/70  Pulse 62  Temp(Src) 98.4 F (36.9 C) (Oral)  Resp 18  SpO2 97% Physical Exam  Nursing note and vitals reviewed. Constitutional: He is oriented to person, place, and time. He appears well-developed and well-nourished. No distress.  HENT:  Head: Normocephalic and atraumatic.  Cardiovascular: Normal rate.   Pulmonary/Chest: Effort normal.  Neurological: He is alert and oriented to person, place, and time.  Skin: Skin is warm and dry. No rash noted. No erythema.  2 superficial skin tears at right mid forearm. No visible active bleeding.   Psychiatric: He has a normal mood and affect. His behavior is normal.    ED Course  Procedures (including critical care time) Labs Review Labs Reviewed - No data to display  Imaging Review No results found.   MDM  1. Skin tear of right forearm without complication, initial encounter    Wounds cleaned (gently) with saline and skin edges loosely re-approximated with steri strips and dressed with gentle pressure dressing. No active bleeding during closure. Advised to leave dressing in place x 48 hours and then begin daily wound care. Patient given tetanus booster while at [facility]. Voices understanding of wound care at home.     Lutricia Feil, [place] 05/08/14 [phone]

## 2014-05-08 NOTE — ED Provider Notes (Signed)
Medical screening examination/treatment/procedure(s) were performed by non-physician practitioner and as supervising physician I was immediately available for consultation/collaboration.  Philipp Deputy, M.D.  Harden Mo, MD 05/08/14 [phone]

## 2014-05-08 NOTE — Discharge Instructions (Signed)
Please keep today's dressing clean, dry and in place for the next 48 hours. You may then remove the dressing and wash area gently with warm soapy water and pat dry. Leave sterile tapes in place and allow to fall off on their own over the next 5-7 days. Monitor for infection of bleeding. If you have any difficulties, please return for assistance.   Skin Tear Care A skin tear is a wound in which the top layer of skin has peeled off. This is a common problem with aging because the skin becomes thinner and more fragile as a person gets older. In addition, some medicines, such as oral corticosteroids, can lead to skin thinning if taken for long periods of time.  A skin tear is often repaired with tape or skin adhesive strips. This keeps the skin that has been peeled off in contact with the healthier skin beneath. Depending on the location of the wound, a bandage (dressing) may be applied over the tape or skin adhesive strips. Sometimes, during the healing process, the skin turns black and dies. Even when this happens, the torn skin acts as a good dressing until the skin underneath gets healthier and repairs itself. HOME CARE INSTRUCTIONS   Change dressings once per day or as directed by your caregiver.  Gently clean the skin tear and the area around the tear using saline solution or mild soap and water.  Do not rub the injured skin dry. Let the area air dry.  Apply petroleum jelly or an antibiotic cream or ointment to keep the tear moist. This will help the wound heal. Do not allow a scab to form.  If the dressing sticks before the next dressing change, moisten it with warm soapy water and gently remove it.  Protect the injured skin until it has healed.  Only take over-the-counter or prescription medicines as directed by your caregiver.  Take showers or baths using warm soapy water. Apply a new dressing after the shower or bath.  Keep all follow-up appointments as directed by your caregiver.   SEEK IMMEDIATE MEDICAL CARE IF:   You have redness, swelling, or increasing pain in the skin tear.  You havepus coming from the skin tear.  You have chills.  You have a red streak that goes away from the skin tear.  You have a bad smell coming from the tear or dressing.  You have a fever or persistent symptoms for more than 2-3 days.  You have a fever and your symptoms suddenly get worse. MAKE SURE YOU:  Understand these instructions.  Will watch this condition.  Will get help right away if your child is not doing well or gets worse. Document Released: 04/06/2001 Document Revised: 04/05/2012 Document Reviewed: 01/24/2012 [facility] Patient Information 2015 [place], [place]. This information is not intended to replace advice given to you by your health care provider. Make sure you discuss any questions you have with your health care provider.

## 2014-05-08 NOTE — ED Notes (Signed)
Reportedly sustained laceration to right arm while working at home yesterday. Started bleeding again this AM. Pressure dressning applied

## 2014-05-17 ENCOUNTER — Ambulatory Visit (INDEPENDENT_AMBULATORY_CARE_PROVIDER_SITE_OTHER): Payer: Medicare Other | Admitting: *Deleted

## 2014-05-17 DIAGNOSIS — I4891 Unspecified atrial fibrillation: Secondary | ICD-10-CM

## 2014-05-17 DIAGNOSIS — Z5181 Encounter for therapeutic drug level monitoring: Secondary | ICD-10-CM

## 2014-05-17 DIAGNOSIS — Z7901 Long term (current) use of anticoagulants: Secondary | ICD-10-CM

## 2014-05-17 LAB — POCT INR: INR: 3.1

## 2014-05-20 ENCOUNTER — Other Ambulatory Visit: Payer: Self-pay | Admitting: Internal Medicine

## 2014-06-14 ENCOUNTER — Ambulatory Visit (INDEPENDENT_AMBULATORY_CARE_PROVIDER_SITE_OTHER): Payer: Medicare Other

## 2014-06-14 DIAGNOSIS — Z5181 Encounter for therapeutic drug level monitoring: Secondary | ICD-10-CM

## 2014-06-14 DIAGNOSIS — I4891 Unspecified atrial fibrillation: Secondary | ICD-10-CM

## 2014-06-14 DIAGNOSIS — Z7901 Long term (current) use of anticoagulants: Secondary | ICD-10-CM

## 2014-06-14 LAB — POCT INR: INR: 2.8

## 2014-07-12 ENCOUNTER — Encounter: Payer: Self-pay | Admitting: Internal Medicine

## 2014-07-12 ENCOUNTER — Ambulatory Visit (INDEPENDENT_AMBULATORY_CARE_PROVIDER_SITE_OTHER): Payer: Medicare Other | Admitting: Internal Medicine

## 2014-07-12 VITALS — BP 158/76 | HR 58 | Temp 97.4°F | Ht 68.0 in | Wt 176.0 lb

## 2014-07-12 DIAGNOSIS — I10 Essential (primary) hypertension: Secondary | ICD-10-CM

## 2014-07-12 DIAGNOSIS — I4891 Unspecified atrial fibrillation: Secondary | ICD-10-CM

## 2014-07-12 DIAGNOSIS — Z0000 Encounter for general adult medical examination without abnormal findings: Secondary | ICD-10-CM

## 2014-07-12 DIAGNOSIS — E785 Hyperlipidemia, unspecified: Secondary | ICD-10-CM

## 2014-07-12 LAB — HEPATIC FUNCTION PANEL
ALT: 18 U/L (ref 0–53)
AST: 24 U/L (ref 0–37)
Albumin: 4.3 g/dL (ref 3.5–5.2)
Alkaline Phosphatase: 55 U/L (ref 39–117)
Bilirubin, Direct: 0.1 mg/dL (ref 0.0–0.3)
Indirect Bilirubin: 0.3 mg/dL (ref 0.2–1.2)
TOTAL PROTEIN: 7 g/dL (ref 6.0–8.3)
Total Bilirubin: 0.4 mg/dL (ref 0.2–1.2)

## 2014-07-12 LAB — LIPID PANEL
CHOLESTEROL: 136 mg/dL (ref 0–200)
HDL: 34 mg/dL — ABNORMAL LOW (ref >39)
LDL Cholesterol: 81 mg/dL (ref 0–99)
TRIGLYCERIDES: 107 mg/dL (ref <150)
Total CHOL/HDL Ratio: 4 Ratio
VLDL: 21 mg/dL (ref 0–40)

## 2014-07-12 LAB — BASIC METABOLIC PANEL
BUN: 19 mg/dL (ref 6–23)
CHLORIDE: 103 meq/L (ref 96–112)
CO2: 27 meq/L (ref 19–32)
CREATININE: 1.04 mg/dL (ref 0.50–1.35)
Calcium: 9.6 mg/dL (ref 8.4–10.5)
Glucose, Bld: 82 mg/dL (ref 70–99)
Potassium: 5.3 mEq/L (ref 3.5–5.3)
Sodium: 142 mEq/L (ref 135–145)

## 2014-07-12 NOTE — Assessment & Plan Note (Signed)
80 year old white male with history of atrial fibrillation, hypertension and hyperlipidemia presents for Medicare wellness exam. Screening for substance abuse, falls, and depression negative. He has history of mild cognitive impairment. He is taking Aricept on a regular basis.  Patient advised to follow-up with cardiologist for high-frequency hearing loss.  Patient updated with Prevnar 13 vaccine.

## 2014-07-12 NOTE — Assessment & Plan Note (Signed)
Monitor LFTs.  Check FLP.
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
## 2014-07-12 NOTE — Assessment & Plan Note (Signed)
Blood pressure sporadically elevated today. Patient is sometimes forgetting to take his medication. Patient advised to use pillbox to help him remember to take his medications. Also monitor blood pressure at home. Reassess in 4 weeks.

## 2014-07-12 NOTE — Patient Instructions (Signed)
Monitor your blood pressure twice per week Use a pill box to help you remember to take your medications.

## 2014-07-13 LAB — CBC WITH DIFFERENTIAL/PLATELET
BASOS ABS: 0 10*3/uL (ref 0.0–0.1)
Basophils Relative: 0 % (ref 0–1)
EOS ABS: 0.2 10*3/uL (ref 0.0–0.7)
EOS PCT: 3 % (ref 0–5)
HCT: 42.6 % (ref 39.0–52.0)
Hemoglobin: 14.2 g/dL (ref 13.0–17.0)
Lymphocytes Relative: 21 % (ref 12–46)
Lymphs Abs: 1.5 10*3/uL (ref 0.7–4.0)
MCH: 31.3 pg (ref 26.0–34.0)
MCHC: 33.3 g/dL (ref 30.0–36.0)
MCV: 94 fL (ref 78.0–100.0)
MPV: 13.2 fL — ABNORMAL HIGH (ref 9.4–12.4)
Monocytes Absolute: 0.9 10*3/uL (ref 0.1–1.0)
Monocytes Relative: 12 % (ref 3–12)
Neutro Abs: 4.5 10*3/uL (ref 1.7–7.7)
Neutrophils Relative %: 64 % (ref 43–77)
PLATELETS: 188 10*3/uL (ref 150–400)
RBC: 4.53 MIL/uL (ref 4.22–5.81)
RDW: 13.7 % (ref 11.5–15.5)
WBC: 7.1 10*3/uL (ref 4.0–10.5)

## 2014-07-13 LAB — TSH: TSH: 4.487 u[IU]/mL (ref 0.350–4.500)

## 2014-07-17 ENCOUNTER — Encounter: Payer: Self-pay | Admitting: *Deleted

## 2014-07-22 ENCOUNTER — Other Ambulatory Visit: Payer: Self-pay | Admitting: Cardiology

## 2014-07-29 ENCOUNTER — Ambulatory Visit (INDEPENDENT_AMBULATORY_CARE_PROVIDER_SITE_OTHER): Payer: Medicare Other

## 2014-07-29 DIAGNOSIS — Z5181 Encounter for therapeutic drug level monitoring: Secondary | ICD-10-CM | POA: Diagnosis not present

## 2014-07-29 DIAGNOSIS — I4891 Unspecified atrial fibrillation: Secondary | ICD-10-CM

## 2014-07-29 DIAGNOSIS — Z7901 Long term (current) use of anticoagulants: Secondary | ICD-10-CM | POA: Diagnosis not present

## 2014-07-29 LAB — POCT INR: INR: 4.4

## 2014-08-13 ENCOUNTER — Ambulatory Visit (INDEPENDENT_AMBULATORY_CARE_PROVIDER_SITE_OTHER): Payer: Medicare Other | Admitting: Pharmacist

## 2014-08-13 DIAGNOSIS — Z7901 Long term (current) use of anticoagulants: Secondary | ICD-10-CM

## 2014-08-13 DIAGNOSIS — I4891 Unspecified atrial fibrillation: Secondary | ICD-10-CM

## 2014-08-13 DIAGNOSIS — Z5181 Encounter for therapeutic drug level monitoring: Secondary | ICD-10-CM | POA: Diagnosis not present

## 2014-08-13 LAB — POCT INR: INR: 2.5

## 2014-08-14 ENCOUNTER — Ambulatory Visit (INDEPENDENT_AMBULATORY_CARE_PROVIDER_SITE_OTHER): Payer: Medicare Other | Admitting: Internal Medicine

## 2014-08-14 ENCOUNTER — Encounter: Payer: Self-pay | Admitting: Internal Medicine

## 2014-08-14 VITALS — BP 136/72 | HR 60 | Temp 97.9°F | Ht 68.0 in | Wt 174.0 lb

## 2014-08-14 DIAGNOSIS — I480 Paroxysmal atrial fibrillation: Secondary | ICD-10-CM | POA: Diagnosis not present

## 2014-08-14 DIAGNOSIS — L989 Disorder of the skin and subcutaneous tissue, unspecified: Secondary | ICD-10-CM

## 2014-08-14 DIAGNOSIS — I10 Essential (primary) hypertension: Secondary | ICD-10-CM | POA: Diagnosis not present

## 2014-08-14 DIAGNOSIS — F0390 Unspecified dementia without behavioral disturbance: Secondary | ICD-10-CM | POA: Diagnosis not present

## 2014-08-14 MED ORDER — DONEPEZIL HCL 10 MG PO TABS: 10.0000 mg | ORAL_TABLET | Freq: Every day | ORAL | Status: DC

## 2014-08-14 NOTE — Patient Instructions (Signed)
Follow-up with your cardiologist as directed.

## 2014-08-14 NOTE — Assessment & Plan Note (Addendum)
Patient has recurrent scaly skin lesion of left ear. Refer dermatology (Dr. Allyson Sabal) for biopsy/treatment.

## 2014-08-14 NOTE — Progress Notes (Signed)
Pre visit review using our clinic review tool, if applicable. No additional management support is needed unless otherwise documented below in the visit note. 

## 2014-08-14 NOTE — Progress Notes (Signed)
Subjective:    Patient ID: Geoffrey Romero, male    DOB: 15-Oct-1923, 81 y.o.   MRN: [id number]  HPI  81 year old white male with history of hypertension, atrial fibrillation and memory loss for follow-up. Patient's wife reports patient has been better with taking his blood pressure medication.  Patient reports episode of tachycardia. Wife reports his pulse was in the 120s. Patient did not experience associated chest pain or dizziness. His heart rate returned to normal after sitting in recliner and resting.  He also complains for recurrent scaly skin lesion of left ear.  Review of Systems Negative for chest pain , normal gait.    Past Medical History  Diagnosis Date  . Hypertension   . Hyperlipidemia   . History of diverticulitis of colon     history of diverticulosis  . PAF (paroxysmal atrial fibrillation)     on flecainide  . Long term current use of anticoagulant     anticoagulant therapy  . BPH (benign prostatic hyperplasia)   . Balanitis     phimosis, preputial adhesions  . CTS (carpal tunnel syndrome)   . History of sinus bradycardia     history of bradycardia on hig doses of Cardizem  . Left bundle branch block     normal Myoview    History   Social History  . Marital Status: Married    Spouse Name: N/A    Number of Children: N/A  . Years of Education: N/A   Occupational History  . Not on file.   Social History Main Topics  . Smoking status: Former Research scientist (life sciences)  . Smokeless tobacco: Never Used  . Alcohol Use: No  . Drug Use: No  . Sexual Activity: No   Other Topics Concern  . 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   No tobacco    No alcohol               Past Surgical History  Procedure Laterality Date  . Knee arthroscopy  07/2006    left knee debridement, including partial medial and lateral menisectomy  . Lumbar laminectomy/decompression microdiscectomy  2004    L4-5  . Circumcision  2006  .  Carpal tunnel release  2007/2008    Family History  Problem Relation Age of Onset  . Breast cancer Sister   . Stroke Mother   . Diabetes Mother     Allergies  Allergen Reactions  . Tambocor [Flecainide] Other (See Comments)    Mental Issues.  . Penicillins     REACTION: anaphylaxis    Current Outpatient Prescriptions on File Prior to Visit  Medication Sig Dispense Refill  . donepezil (ARICEPT) 10 MG tablet TAKE ONE TABLET BY MOUTH EVERY NIGHT AT BEDTIME 90 tablet 1  . finasteride (PROSCAR) 5 MG tablet Take 1 tablet (5 mg total) by mouth daily. 90 tablet 1  . lisinopril (PRINIVIL,ZESTRIL) 10 MG tablet TAKE ONE (1) TABLET BY MOUTH EVERY DAY 90 tablet 1  . pravastatin (PRAVACHOL) 20 MG tablet TAKE ONE-HALF TABLET BY MOUTH DAILY 90 tablet 1  . warfarin (COUMADIN) 5 MG tablet Take 1.5 tablets by mouth daily or as directed by coumadin clinic 135 tablet 1  . [DISCONTINUED] diltiazem (CARDIZEM) 30 MG tablet Take 30 mg by mouth 3 (three) times daily.      No current facility-administered medications on file prior to visit.    BP 136/72 mmHg  Pulse 60  Temp(Src) 97.9  F (36.6 C) (Oral)  Ht 5\' 8"  (1.727 m)  Wt 174 lb (78.926 kg)  BMI 26.46 kg/m2    Objective:   Physical Exam  Constitutional: He is oriented to person, place, and time. He appears well-developed and well-nourished.  HENT:  Head: Normocephalic and atraumatic.  Neck: Neck supple.  Cardiovascular: Normal rate, regular rhythm and normal heart sounds.   Pulmonary/Chest: Effort normal and breath sounds normal. He has no wheezes.  Musculoskeletal: He exhibits no edema.  Neurological: He is alert and oriented to person, place, and time.  Normal gait  Skin: Skin is warm and dry.  Scaly skin lesion left ear lobe  Psychiatric: He has a normal mood and affect. His behavior is normal.          Assessment & Plan:

## 2014-08-14 NOTE — Assessment & Plan Note (Signed)
Improved.  Use of pillbox encouraged.  No change in medication. BP: 136/72 mmHg

## 2014-08-14 NOTE — Assessment & Plan Note (Signed)
Stable.  Continue same dose of Aricept.

## 2014-08-14 NOTE — Assessment & Plan Note (Addendum)
Patient reports experiencing episode of tachycardia. Wife recorded heart rate of 120 bpm. His episode was transient. He likely had a run of atrial fibrillation.  He is not on any AV nodal agents. Arrange follow-up with cardiology (Dr. Stanford Breed)  Continue anticoagulation.  Minimal fall risk.

## 2014-08-16 ENCOUNTER — Ambulatory Visit: Payer: Medicare Other | Admitting: Cardiology

## 2014-09-02 ENCOUNTER — Ambulatory Visit: Payer: Medicare Other | Admitting: Physician Assistant

## 2014-09-03 ENCOUNTER — Ambulatory Visit (INDEPENDENT_AMBULATORY_CARE_PROVIDER_SITE_OTHER): Payer: Medicare Other | Admitting: *Deleted

## 2014-09-03 DIAGNOSIS — I4891 Unspecified atrial fibrillation: Secondary | ICD-10-CM | POA: Diagnosis not present

## 2014-09-03 DIAGNOSIS — Z5181 Encounter for therapeutic drug level monitoring: Secondary | ICD-10-CM

## 2014-09-03 DIAGNOSIS — I480 Paroxysmal atrial fibrillation: Secondary | ICD-10-CM | POA: Diagnosis not present

## 2014-09-03 DIAGNOSIS — Z7901 Long term (current) use of anticoagulants: Secondary | ICD-10-CM | POA: Diagnosis not present

## 2014-09-03 LAB — POCT INR: INR: 2.2

## 2014-09-06 ENCOUNTER — Ambulatory Visit: Payer: Medicare Other | Admitting: Physician Assistant

## 2014-09-19 NOTE — Progress Notes (Signed)
HPI: FU paroxysmal atrial fibrillation. Monitor in Jan 2013 showed PAF. TSH normal. Lexiscan Myoview in March of 2013 showed an ejection fraction of 54% and normal perfusion. Patient seen in June of 2013 for syncope. Patient's flecainide was discontinued. Head CT showed no skull fracture. There was a right frontal extra-axial process suggesting a vessel rather than a subdural hematoma. Event monitor showed sinus rhythm with PACs. It was felt his syncopal episode may have been related to dehydration. Last echocardiogram in May 2014 showed normal LV function, moderate mitral regurgitation. Since I last saw him, he has had 2 short bouts of heart racing. Otherwise no dyspnea, chest pain, palpitations or syncope.  Current Outpatient Prescriptions  Medication Sig Dispense Refill  . donepezil (ARICEPT) 10 MG tablet Take 1 tablet (10 mg total) by mouth at bedtime. 90 tablet 1  . finasteride (PROSCAR) 5 MG tablet Take 1 tablet (5 mg total) by mouth daily. 90 tablet 1  . lisinopril (PRINIVIL,ZESTRIL) 10 MG tablet TAKE ONE (1) TABLET BY MOUTH EVERY DAY 90 tablet 1  . Multiple Vitamins-Minerals (PRESERVISION AREDS 2) CAPS Take 1 capsule by mouth 2 (two) times daily.    . pravastatin (PRAVACHOL) 20 MG tablet TAKE ONE-HALF TABLET BY MOUTH DAILY 90 tablet 1  . warfarin (COUMADIN) 5 MG tablet Take 1.5 tablets by mouth daily or as directed by coumadin clinic 135 tablet 1  . [DISCONTINUED] diltiazem (CARDIZEM) 30 MG tablet Take 30 mg by mouth 3 (three) times daily.      No current facility-administered medications for this visit.     Past Medical History  Diagnosis Date  . Hypertension   . Hyperlipidemia   . History of diverticulitis of colon     history of diverticulosis  . PAF (paroxysmal atrial fibrillation)     on flecainide  . Long term current use of anticoagulant     anticoagulant therapy  . BPH (benign prostatic hyperplasia)   . Balanitis     phimosis, preputial adhesions  . CTS (carpal  tunnel syndrome)   . History of sinus bradycardia     history of bradycardia on hig doses of Cardizem  . Left bundle branch block     normal Myoview    Past Surgical History  Procedure Laterality Date  . Knee arthroscopy  07/2006    left knee debridement, including partial medial and lateral menisectomy  . Lumbar laminectomy/decompression microdiscectomy  2004    L4-5  . Circumcision  2006  . Carpal tunnel release  2007/2008    History   Social History  . Marital Status: Married    Spouse Name: N/A  . Number of Children: N/A  . Years of Education: N/A   Occupational History  . Not on file.   Social History Main Topics  . Smoking status: Former Research scientist (life sciences)  . Smokeless tobacco: Never Used  . Alcohol Use: No  . Drug Use: No  . Sexual Activity: No   Other Topics Concern  . 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   No tobacco    No alcohol               ROS: no fevers or chills, productive cough, hemoptysis, dysphasia, odynophagia, melena, hematochezia, dysuria, hematuria, rash, seizure activity, orthopnea, PND, pedal edema, claudication. Remaining systems are negative.  Physical Exam: Well-developed well-nourished in no acute distress.  Skin is warm and dry.  HEENT is normal.  Neck is supple.  Chest is clear to auscultation with normal expansion.  Cardiovascular exam is regular rate and rhythm.  Abdominal exam nontender or distended. No masses palpated. Extremities show no edema. neuro grossly intact  ECG sinus rhythm at a rate of 64. Left bundle branch block.

## 2014-09-20 ENCOUNTER — Encounter: Payer: Self-pay | Admitting: Cardiology

## 2014-09-20 ENCOUNTER — Ambulatory Visit (INDEPENDENT_AMBULATORY_CARE_PROVIDER_SITE_OTHER): Payer: Medicare Other | Admitting: Cardiology

## 2014-09-20 VITALS — BP 130/70 | HR 64 | Ht 68.0 in | Wt 175.3 lb

## 2014-09-20 DIAGNOSIS — I480 Paroxysmal atrial fibrillation: Secondary | ICD-10-CM

## 2014-09-20 MED ORDER — METOPROLOL SUCCINATE ER 25 MG PO TB24: 25.0000 mg | ORAL_TABLET | Freq: Every day | ORAL | Status: AC

## 2014-09-20 NOTE — Assessment & Plan Note (Addendum)
The patient is in sinus rhythm today. However he had 2 bouts of heart racing most likely secondary to recurrent atrial fibrillation. I will add Toprol 25 mg daily. Follow closely for any evidence of bradycardia or heart block. Continue Coumadin. If he has more frequent episodes in the future we will consider a different antiarrhythmic such as amiodarone. Mild dementia but no history of falls. We will follow this closely and discontinue anticoagulation if risk begins to outweigh the benefit.

## 2014-09-20 NOTE — Assessment & Plan Note (Signed)
Continue statin. 

## 2014-09-20 NOTE — Patient Instructions (Signed)
Your physician recommends that you schedule a follow-up appointment in: 8 WEEKS WITH DR CRENSHAW  START METOPROLOL SUCC ER 25 MG ONE TABLET DAILY AT BEDTIME

## 2014-09-20 NOTE — Assessment & Plan Note (Signed)
Blood pressure controlled. Continue present medications. 

## 2014-10-01 ENCOUNTER — Ambulatory Visit (INDEPENDENT_AMBULATORY_CARE_PROVIDER_SITE_OTHER): Payer: Medicare Other | Admitting: Pharmacist Clinician (PhC)/ Clinical Pharmacy Specialist

## 2014-10-01 DIAGNOSIS — I480 Paroxysmal atrial fibrillation: Secondary | ICD-10-CM

## 2014-10-01 DIAGNOSIS — I4891 Unspecified atrial fibrillation: Secondary | ICD-10-CM | POA: Diagnosis not present

## 2014-10-01 DIAGNOSIS — Z5181 Encounter for therapeutic drug level monitoring: Secondary | ICD-10-CM | POA: Diagnosis not present

## 2014-10-01 DIAGNOSIS — Z7901 Long term (current) use of anticoagulants: Secondary | ICD-10-CM

## 2014-10-01 LAB — POCT INR: INR: 3.7

## 2014-10-02 DIAGNOSIS — L570 Actinic keratosis: Secondary | ICD-10-CM | POA: Diagnosis not present

## 2014-10-08 DIAGNOSIS — H3531 Nonexudative age-related macular degeneration: Secondary | ICD-10-CM | POA: Diagnosis not present

## 2014-10-08 DIAGNOSIS — H43813 Vitreous degeneration, bilateral: Secondary | ICD-10-CM | POA: Diagnosis not present

## 2014-10-14 ENCOUNTER — Other Ambulatory Visit: Payer: Self-pay | Admitting: Internal Medicine

## 2014-10-15 ENCOUNTER — Ambulatory Visit (INDEPENDENT_AMBULATORY_CARE_PROVIDER_SITE_OTHER): Payer: Medicare Other | Admitting: *Deleted

## 2014-10-15 DIAGNOSIS — I480 Paroxysmal atrial fibrillation: Secondary | ICD-10-CM | POA: Diagnosis not present

## 2014-10-15 DIAGNOSIS — Z7901 Long term (current) use of anticoagulants: Secondary | ICD-10-CM

## 2014-10-15 DIAGNOSIS — Z5181 Encounter for therapeutic drug level monitoring: Secondary | ICD-10-CM | POA: Diagnosis not present

## 2014-10-15 DIAGNOSIS — I4891 Unspecified atrial fibrillation: Secondary | ICD-10-CM | POA: Diagnosis not present

## 2014-10-15 LAB — POCT INR: INR: 2.1

## 2014-11-05 ENCOUNTER — Ambulatory Visit (INDEPENDENT_AMBULATORY_CARE_PROVIDER_SITE_OTHER): Payer: Medicare Other | Admitting: *Deleted

## 2014-11-05 DIAGNOSIS — Z5181 Encounter for therapeutic drug level monitoring: Secondary | ICD-10-CM | POA: Diagnosis not present

## 2014-11-05 DIAGNOSIS — I480 Paroxysmal atrial fibrillation: Secondary | ICD-10-CM

## 2014-11-05 DIAGNOSIS — Z7901 Long term (current) use of anticoagulants: Secondary | ICD-10-CM | POA: Diagnosis not present

## 2014-11-05 DIAGNOSIS — I4891 Unspecified atrial fibrillation: Secondary | ICD-10-CM

## 2014-11-05 LAB — POCT INR: INR: 1.6

## 2014-11-08 NOTE — Progress Notes (Signed)
HPI: FU paroxysmal atrial fibrillation. Monitor in Jan 2013 showed PAF. TSH normal. Lexiscan Myoview in March of 2013 showed an ejection fraction of 54% and normal perfusion. Patient seen in June of 2013 for syncope. Patient's flecainide was discontinued. Head CT showed no skull fracture. There was a right frontal extra-axial process suggesting a vessel rather than a subdural hematoma. Event monitor showed sinus rhythm with PACs. It was felt his syncopal episode may have been related to dehydration. Last echocardiogram in May 2014 showed normal LV function, moderate mitral regurgitation. Since I last saw him, the patient denies any dyspnea on exertion, orthopnea, PND, pedal edema, palpitations, syncope or chest pain.   Current Outpatient Prescriptions  Medication Sig Dispense Refill  . donepezil (ARICEPT) 10 MG tablet Take 1 tablet (10 mg total) by mouth at bedtime. 90 tablet 1  . finasteride (PROSCAR) 5 MG tablet Take 1 tablet (5 mg total) by mouth daily. 90 tablet 1  . lisinopril (PRINIVIL,ZESTRIL) 10 MG tablet TAKE ONE (1) TABLET BY MOUTH EVERY DAY 90 tablet 1  . metoprolol succinate (TOPROL XL) 25 MG 24 hr tablet Take 1 tablet (25 mg total) by mouth daily. 90 tablet 3  . Multiple Vitamins-Minerals (PRESERVISION AREDS 2) CAPS Take 1 capsule by mouth 2 (two) times daily.    . pravastatin (PRAVACHOL) 20 MG tablet TAKE ONE-HALF TABLET BY MOUTH DAILY 90 tablet 1  . warfarin (COUMADIN) 5 MG tablet Take 1.5 tablets by mouth daily or as directed by coumadin clinic 135 tablet 1  . [DISCONTINUED] diltiazem (CARDIZEM) 30 MG tablet Take 30 mg by mouth 3 (three) times daily.      No current facility-administered medications for this visit.     Past Medical History  Diagnosis Date  . Hypertension   . Hyperlipidemia   . History of diverticulitis of colon     history of diverticulosis  . PAF (paroxysmal atrial fibrillation)     on flecainide  . Long term current use of anticoagulant    anticoagulant therapy  . BPH (benign prostatic hyperplasia)   . Balanitis     phimosis, preputial adhesions  . CTS (carpal tunnel syndrome)   . History of sinus bradycardia     history of bradycardia on hig doses of Cardizem  . Left bundle branch block     normal Myoview    Past Surgical History  Procedure Laterality Date  . Knee arthroscopy  07/2006    left knee debridement, including partial medial and lateral menisectomy  . Lumbar laminectomy/decompression microdiscectomy  2004    L4-5  . Circumcision  2006  . Carpal tunnel release  2007/2008    History   Social History  . Marital Status: Married    Spouse Name: N/A  . Number of Children: N/A  . Years of Education: N/A   Occupational History  . Not on file.   Social History Main Topics  . Smoking status: Former Research scientist (life sciences)  . Smokeless tobacco: Never Used  . Alcohol Use: No  . Drug Use: No  . Sexual Activity: No   Other Topics Concern  . 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   No tobacco    No alcohol               ROS: no fevers or chills, productive cough, hemoptysis, dysphasia, odynophagia, melena, hematochezia, dysuria, hematuria, rash, seizure activity, orthopnea, PND, pedal edema, claudication. Remaining systems  are negative.  Physical Exam: Well-developed well-nourished in no acute distress.  Skin is warm and dry.  HEENT is normal.  Neck is supple.  Chest is clear to auscultation with normal expansion.  Cardiovascular exam is regular rate and rhythm.  Abdominal exam nontender or distended. No masses palpated. Extremities show no edema. neuro grossly intact

## 2014-11-11 ENCOUNTER — Ambulatory Visit (INDEPENDENT_AMBULATORY_CARE_PROVIDER_SITE_OTHER): Payer: Medicare Other | Admitting: Cardiology

## 2014-11-11 ENCOUNTER — Ambulatory Visit: Payer: Medicare Other | Admitting: Pharmacist Clinician (PhC)/ Clinical Pharmacy Specialist

## 2014-11-11 ENCOUNTER — Ambulatory Visit (INDEPENDENT_AMBULATORY_CARE_PROVIDER_SITE_OTHER): Payer: Medicare Other | Admitting: Pharmacist Clinician (PhC)/ Clinical Pharmacy Specialist

## 2014-11-11 ENCOUNTER — Encounter: Payer: Self-pay | Admitting: Cardiology

## 2014-11-11 VITALS — BP 130/58 | HR 58 | Ht 68.0 in | Wt 177.8 lb

## 2014-11-11 DIAGNOSIS — I480 Paroxysmal atrial fibrillation: Secondary | ICD-10-CM

## 2014-11-11 DIAGNOSIS — Z7901 Long term (current) use of anticoagulants: Secondary | ICD-10-CM | POA: Diagnosis not present

## 2014-11-11 DIAGNOSIS — I4891 Unspecified atrial fibrillation: Secondary | ICD-10-CM

## 2014-11-11 DIAGNOSIS — Z5181 Encounter for therapeutic drug level monitoring: Secondary | ICD-10-CM

## 2014-11-11 DIAGNOSIS — I10 Essential (primary) hypertension: Secondary | ICD-10-CM | POA: Diagnosis not present

## 2014-11-11 DIAGNOSIS — E785 Hyperlipidemia, unspecified: Secondary | ICD-10-CM | POA: Diagnosis not present

## 2014-11-11 LAB — POCT INR: INR: 2.3

## 2014-11-11 NOTE — Assessment & Plan Note (Signed)
The patient remains in sinus rhythm on exam. Continue Toprol 25 mg daily. Follow closely for any evidence of bradycardia or heart block. Continue Coumadin. If he has more frequent episodes in the future we will consider a different antiarrhythmic such as amiodarone. Mild dementia but no history of falls. We will follow this closely and discontinue anticoagulation if risk begins to outweigh the benefit.

## 2014-11-11 NOTE — Patient Instructions (Signed)
Your physician wants you to follow-up in: 6 MONTHS WITH DR CRENSHAW You will receive a reminder letter in the mail two months in advance. If you don't receive a letter, please call our office to schedule the follow-up appointment.  

## 2014-11-11 NOTE — Assessment & Plan Note (Signed)
Blood pressure controlled. Continue present medications. 

## 2014-11-11 NOTE — Assessment & Plan Note (Signed)
Continue statin. 

## 2014-12-04 ENCOUNTER — Ambulatory Visit (INDEPENDENT_AMBULATORY_CARE_PROVIDER_SITE_OTHER): Payer: Medicare Other | Admitting: *Deleted

## 2014-12-04 DIAGNOSIS — Z5181 Encounter for therapeutic drug level monitoring: Secondary | ICD-10-CM | POA: Diagnosis not present

## 2014-12-04 DIAGNOSIS — Z7901 Long term (current) use of anticoagulants: Secondary | ICD-10-CM

## 2014-12-04 DIAGNOSIS — I4891 Unspecified atrial fibrillation: Secondary | ICD-10-CM

## 2014-12-04 DIAGNOSIS — I480 Paroxysmal atrial fibrillation: Secondary | ICD-10-CM

## 2014-12-04 LAB — POCT INR: INR: 5.2

## 2014-12-11 DIAGNOSIS — L570 Actinic keratosis: Secondary | ICD-10-CM | POA: Diagnosis not present

## 2014-12-13 ENCOUNTER — Ambulatory Visit (INDEPENDENT_AMBULATORY_CARE_PROVIDER_SITE_OTHER): Payer: Medicare Other | Admitting: *Deleted

## 2014-12-13 DIAGNOSIS — I480 Paroxysmal atrial fibrillation: Secondary | ICD-10-CM

## 2014-12-13 DIAGNOSIS — I4891 Unspecified atrial fibrillation: Secondary | ICD-10-CM | POA: Diagnosis not present

## 2014-12-13 DIAGNOSIS — Z7901 Long term (current) use of anticoagulants: Secondary | ICD-10-CM | POA: Diagnosis not present

## 2014-12-13 DIAGNOSIS — Z5181 Encounter for therapeutic drug level monitoring: Secondary | ICD-10-CM

## 2014-12-13 LAB — POCT INR: INR: 2

## 2014-12-24 DIAGNOSIS — H40013 Open angle with borderline findings, low risk, bilateral: Secondary | ICD-10-CM | POA: Diagnosis not present

## 2014-12-24 DIAGNOSIS — H52203 Unspecified astigmatism, bilateral: Secondary | ICD-10-CM | POA: Diagnosis not present

## 2014-12-24 DIAGNOSIS — Z961 Presence of intraocular lens: Secondary | ICD-10-CM | POA: Diagnosis not present

## 2015-01-24 ENCOUNTER — Ambulatory Visit (INDEPENDENT_AMBULATORY_CARE_PROVIDER_SITE_OTHER): Payer: Medicare Other | Admitting: *Deleted

## 2015-01-24 DIAGNOSIS — Z7901 Long term (current) use of anticoagulants: Secondary | ICD-10-CM | POA: Diagnosis not present

## 2015-01-24 DIAGNOSIS — I480 Paroxysmal atrial fibrillation: Secondary | ICD-10-CM

## 2015-01-24 DIAGNOSIS — Z5181 Encounter for therapeutic drug level monitoring: Secondary | ICD-10-CM | POA: Diagnosis not present

## 2015-01-24 DIAGNOSIS — I4891 Unspecified atrial fibrillation: Secondary | ICD-10-CM | POA: Diagnosis not present

## 2015-01-24 LAB — POCT INR: INR: 3.1

## 2015-01-29 DIAGNOSIS — D225 Melanocytic nevi of trunk: Secondary | ICD-10-CM | POA: Diagnosis not present

## 2015-01-29 DIAGNOSIS — L570 Actinic keratosis: Secondary | ICD-10-CM | POA: Diagnosis not present

## 2015-01-29 DIAGNOSIS — L821 Other seborrheic keratosis: Secondary | ICD-10-CM | POA: Diagnosis not present

## 2015-01-29 DIAGNOSIS — L814 Other melanin hyperpigmentation: Secondary | ICD-10-CM | POA: Diagnosis not present

## 2015-03-07 ENCOUNTER — Ambulatory Visit (INDEPENDENT_AMBULATORY_CARE_PROVIDER_SITE_OTHER): Payer: Medicare Other | Admitting: *Deleted

## 2015-03-07 DIAGNOSIS — I480 Paroxysmal atrial fibrillation: Secondary | ICD-10-CM | POA: Diagnosis not present

## 2015-03-07 DIAGNOSIS — I4891 Unspecified atrial fibrillation: Secondary | ICD-10-CM

## 2015-03-07 DIAGNOSIS — Z5181 Encounter for therapeutic drug level monitoring: Secondary | ICD-10-CM

## 2015-03-07 DIAGNOSIS — Z7901 Long term (current) use of anticoagulants: Secondary | ICD-10-CM

## 2015-03-07 LAB — POCT INR: INR: 3.1

## 2015-03-17 ENCOUNTER — Other Ambulatory Visit: Payer: Self-pay | Admitting: Cardiology

## 2015-04-17 DIAGNOSIS — H3531 Nonexudative age-related macular degeneration: Secondary | ICD-10-CM | POA: Diagnosis not present

## 2015-04-17 DIAGNOSIS — H43813 Vitreous degeneration, bilateral: Secondary | ICD-10-CM | POA: Diagnosis not present

## 2015-04-18 ENCOUNTER — Ambulatory Visit (INDEPENDENT_AMBULATORY_CARE_PROVIDER_SITE_OTHER): Payer: Medicare Other | Admitting: *Deleted

## 2015-04-18 DIAGNOSIS — I480 Paroxysmal atrial fibrillation: Secondary | ICD-10-CM

## 2015-04-18 DIAGNOSIS — Z7901 Long term (current) use of anticoagulants: Secondary | ICD-10-CM | POA: Diagnosis not present

## 2015-04-18 DIAGNOSIS — Z5181 Encounter for therapeutic drug level monitoring: Secondary | ICD-10-CM

## 2015-04-18 DIAGNOSIS — I4891 Unspecified atrial fibrillation: Secondary | ICD-10-CM

## 2015-04-18 LAB — POCT INR: INR: 4.9

## 2015-04-21 DIAGNOSIS — N401 Enlarged prostate with lower urinary tract symptoms: Secondary | ICD-10-CM | POA: Diagnosis not present

## 2015-04-21 DIAGNOSIS — N138 Other obstructive and reflux uropathy: Secondary | ICD-10-CM | POA: Diagnosis not present

## 2015-04-28 NOTE — Progress Notes (Signed)
HPI: FU paroxysmal atrial fibrillation. Monitor in Jan 2013 showed PAF. TSH normal. Lexiscan Myoview in March of 2013 showed an ejection fraction of 54% and normal perfusion. Patient seen in June of 2013 for syncope. Patient's flecainide was discontinued. Head CT showed no skull fracture. There was a right frontal extra-axial process suggesting a vessel rather than a subdural hematoma. Event monitor showed sinus rhythm with PACs. It was felt his syncopal episode may have been related to dehydration. Last echocardiogram in May 2014 showed normal LV function, moderate mitral regurgitation. Since I last saw him, the patient denies any dyspnea on exertion, orthopnea, PND, pedal edema, palpitations, syncope or chest pain.   Current Outpatient Prescriptions  Medication Sig Dispense Refill  . donepezil (ARICEPT) 10 MG tablet Take 1 tablet (10 mg total) by mouth at bedtime. 90 tablet 1  . finasteride (PROSCAR) 5 MG tablet Take 1 tablet (5 mg total) by mouth daily. 90 tablet 1  . lisinopril (PRINIVIL,ZESTRIL) 10 MG tablet TAKE ONE (1) TABLET BY MOUTH EVERY DAY 90 tablet 1  . metoprolol succinate (TOPROL XL) 25 MG 24 hr tablet Take 1 tablet (25 mg total) by mouth daily. 90 tablet 3  . Multiple Vitamins-Minerals (PRESERVISION AREDS 2) CAPS Take 1 capsule by mouth 2 (two) times daily.    . pravastatin (PRAVACHOL) 20 MG tablet TAKE ONE-HALF TABLET BY MOUTH DAILY 90 tablet 1  . warfarin (COUMADIN) 5 MG tablet TAKE AS DIRECTED BY ANTICOAGULATION CLINIC (135 = 90 DAYS) 135 tablet 1  . [DISCONTINUED] diltiazem (CARDIZEM) 30 MG tablet Take 30 mg by mouth 3 (three) times daily.      No current facility-administered medications for this visit.     Past Medical History  Diagnosis Date  . Hypertension   . Hyperlipidemia   . History of diverticulitis of colon     history of diverticulosis  . PAF (paroxysmal atrial fibrillation) (HCC)     on flecainide  . Long term current use of anticoagulant    anticoagulant therapy  . BPH (benign prostatic hyperplasia)   . Balanitis     phimosis, preputial adhesions  . CTS (carpal tunnel syndrome)   . History of sinus bradycardia     history of bradycardia on hig doses of Cardizem  . Left bundle branch block     normal Myoview    Past Surgical History  Procedure Laterality Date  . Knee arthroscopy  07/2006    left knee debridement, including partial medial and lateral menisectomy  . Lumbar laminectomy/decompression microdiscectomy  2004    L4-5  . Circumcision  2006  . Carpal tunnel release  2007/2008    Social History   Social History  . Marital Status: Married    Spouse Name: N/A  . Number of Children: N/A  . Years of Education: N/A   Occupational History  . Not on file.   Social History Main Topics  . Smoking status: Former Research scientist (life sciences)  . Smokeless tobacco: Never Used  . Alcohol Use: No  . Drug Use: No  . Sexual Activity: No   Other Topics Concern  . 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   No tobacco    No alcohol               ROS: no fevers or chills, productive cough, hemoptysis, dysphasia, odynophagia, melena, hematochezia, dysuria, hematuria, rash, seizure activity, orthopnea, PND, pedal edema, claudication. Remaining systems  are negative.  Physical Exam: Well-developed well-nourished in no acute distress.  Skin is warm and dry.  HEENT is normal.  Neck is supple.  Chest is clear to auscultation with normal expansion.  Cardiovascular exam is regular rate and rhythm.  Abdominal exam nontender or distended. No masses palpated. Extremities show no edema. neuro grossly intact  ECG Sinus rhythm at a rate of 58. Left bundle branch block.

## 2015-05-02 ENCOUNTER — Ambulatory Visit (INDEPENDENT_AMBULATORY_CARE_PROVIDER_SITE_OTHER): Payer: Medicare Other | Admitting: *Deleted

## 2015-05-02 DIAGNOSIS — I4891 Unspecified atrial fibrillation: Secondary | ICD-10-CM

## 2015-05-02 DIAGNOSIS — Z5181 Encounter for therapeutic drug level monitoring: Secondary | ICD-10-CM | POA: Diagnosis not present

## 2015-05-02 DIAGNOSIS — Z7901 Long term (current) use of anticoagulants: Secondary | ICD-10-CM

## 2015-05-02 DIAGNOSIS — I480 Paroxysmal atrial fibrillation: Secondary | ICD-10-CM

## 2015-05-02 LAB — POCT INR: INR: 2.6

## 2015-05-05 ENCOUNTER — Encounter: Payer: Self-pay | Admitting: Cardiology

## 2015-05-05 ENCOUNTER — Other Ambulatory Visit: Payer: Self-pay | Admitting: Internal Medicine

## 2015-05-05 ENCOUNTER — Ambulatory Visit (INDEPENDENT_AMBULATORY_CARE_PROVIDER_SITE_OTHER): Payer: Medicare Other | Admitting: Cardiology

## 2015-05-05 VITALS — BP 138/66 | HR 58 | Ht 68.0 in | Wt 177.0 lb

## 2015-05-05 DIAGNOSIS — I10 Essential (primary) hypertension: Secondary | ICD-10-CM | POA: Diagnosis not present

## 2015-05-05 DIAGNOSIS — I480 Paroxysmal atrial fibrillation: Secondary | ICD-10-CM | POA: Diagnosis not present

## 2015-05-05 DIAGNOSIS — E785 Hyperlipidemia, unspecified: Secondary | ICD-10-CM | POA: Diagnosis not present

## 2015-05-05 NOTE — Assessment & Plan Note (Signed)
The patient remains in sinus rhythm on exam. Continue Toprol 25 mg daily. Follow closely for any evidence of bradycardia or heart block. Continue Coumadin. If he has more frequent episodes in the future we will consider a different antiarrhythmic such as amiodarone. Mild dementia but no history of falls. We will follow this closely and discontinue anticoagulation if risk begins to outweigh the benefit. I have provided the names of DOACs. He will check with his insurance company and if covered we will transition.

## 2015-05-05 NOTE — Assessment & Plan Note (Signed)
Blood pressure controlled. Continue present medications. 

## 2015-05-05 NOTE — Assessment & Plan Note (Signed)
Continue statin. 

## 2015-05-05 NOTE — Patient Instructions (Signed)
Medication Instructions:   NO CHANGE  Follow-Up:  Your physician wants you to follow-up in: [place] will receive a reminder letter in the mail two months in advance. If you don't receive a letter, please call our office to schedule the follow-up appointment.   Any Other Special Instructions Will Be Listed Below (If Applicable).  [place] TO REPLACE WARFARIN

## 2015-05-14 ENCOUNTER — Ambulatory Visit: Payer: Medicare Other | Admitting: Internal Medicine

## 2015-05-14 ENCOUNTER — Encounter: Payer: Self-pay | Admitting: Adult Health

## 2015-05-14 ENCOUNTER — Ambulatory Visit (INDEPENDENT_AMBULATORY_CARE_PROVIDER_SITE_OTHER): Payer: Medicare Other | Admitting: Adult Health

## 2015-05-14 VITALS — BP 120/80 | Temp 98.6°F | Ht 68.0 in | Wt 178.5 lb

## 2015-05-14 DIAGNOSIS — Z23 Encounter for immunization: Secondary | ICD-10-CM

## 2015-05-15 NOTE — Progress Notes (Signed)
No charge, injection only

## 2015-05-21 ENCOUNTER — Ambulatory Visit (INDEPENDENT_AMBULATORY_CARE_PROVIDER_SITE_OTHER): Payer: Medicare Other | Admitting: *Deleted

## 2015-05-21 DIAGNOSIS — Z7901 Long term (current) use of anticoagulants: Secondary | ICD-10-CM

## 2015-05-21 DIAGNOSIS — I480 Paroxysmal atrial fibrillation: Secondary | ICD-10-CM

## 2015-05-21 DIAGNOSIS — Z5181 Encounter for therapeutic drug level monitoring: Secondary | ICD-10-CM

## 2015-05-21 DIAGNOSIS — I4891 Unspecified atrial fibrillation: Secondary | ICD-10-CM | POA: Diagnosis not present

## 2015-05-21 LAB — POCT INR: INR: 2.6

## 2015-05-23 ENCOUNTER — Other Ambulatory Visit: Payer: Self-pay | Admitting: Internal Medicine

## 2015-06-25 ENCOUNTER — Ambulatory Visit (INDEPENDENT_AMBULATORY_CARE_PROVIDER_SITE_OTHER): Payer: Medicare Other | Admitting: *Deleted

## 2015-06-25 DIAGNOSIS — Z7901 Long term (current) use of anticoagulants: Secondary | ICD-10-CM | POA: Diagnosis not present

## 2015-06-25 DIAGNOSIS — I4891 Unspecified atrial fibrillation: Secondary | ICD-10-CM | POA: Diagnosis not present

## 2015-06-25 DIAGNOSIS — Z5181 Encounter for therapeutic drug level monitoring: Secondary | ICD-10-CM | POA: Diagnosis not present

## 2015-06-25 DIAGNOSIS — I480 Paroxysmal atrial fibrillation: Secondary | ICD-10-CM

## 2015-06-25 LAB — POCT INR: INR: 2

## 2015-07-23 ENCOUNTER — Ambulatory Visit (INDEPENDENT_AMBULATORY_CARE_PROVIDER_SITE_OTHER): Payer: Medicare Other | Admitting: *Deleted

## 2015-07-23 DIAGNOSIS — Z5181 Encounter for therapeutic drug level monitoring: Secondary | ICD-10-CM

## 2015-07-23 DIAGNOSIS — Z7901 Long term (current) use of anticoagulants: Secondary | ICD-10-CM | POA: Diagnosis not present

## 2015-07-23 DIAGNOSIS — I4891 Unspecified atrial fibrillation: Secondary | ICD-10-CM

## 2015-07-23 DIAGNOSIS — I480 Paroxysmal atrial fibrillation: Secondary | ICD-10-CM

## 2015-07-23 LAB — POCT INR: INR: 1.6

## 2015-08-06 ENCOUNTER — Ambulatory Visit (INDEPENDENT_AMBULATORY_CARE_PROVIDER_SITE_OTHER): Payer: Medicare Other

## 2015-08-06 DIAGNOSIS — I4891 Unspecified atrial fibrillation: Secondary | ICD-10-CM | POA: Diagnosis not present

## 2015-08-06 DIAGNOSIS — Z5181 Encounter for therapeutic drug level monitoring: Secondary | ICD-10-CM

## 2015-08-06 DIAGNOSIS — Z7901 Long term (current) use of anticoagulants: Secondary | ICD-10-CM | POA: Diagnosis not present

## 2015-08-06 DIAGNOSIS — I480 Paroxysmal atrial fibrillation: Secondary | ICD-10-CM | POA: Diagnosis not present

## 2015-08-06 LAB — POCT INR: INR: 1.8

## 2015-08-20 ENCOUNTER — Ambulatory Visit (INDEPENDENT_AMBULATORY_CARE_PROVIDER_SITE_OTHER): Payer: Medicare Other | Admitting: *Deleted

## 2015-08-20 DIAGNOSIS — Z5181 Encounter for therapeutic drug level monitoring: Secondary | ICD-10-CM | POA: Diagnosis not present

## 2015-08-20 DIAGNOSIS — I480 Paroxysmal atrial fibrillation: Secondary | ICD-10-CM | POA: Diagnosis not present

## 2015-08-20 DIAGNOSIS — I4891 Unspecified atrial fibrillation: Secondary | ICD-10-CM | POA: Diagnosis not present

## 2015-08-20 DIAGNOSIS — Z7901 Long term (current) use of anticoagulants: Secondary | ICD-10-CM

## 2015-08-20 LAB — POCT INR: INR: 3.3

## 2015-09-08 ENCOUNTER — Other Ambulatory Visit: Payer: Self-pay | Admitting: Internal Medicine

## 2015-09-17 ENCOUNTER — Ambulatory Visit (INDEPENDENT_AMBULATORY_CARE_PROVIDER_SITE_OTHER): Payer: Medicare Other | Admitting: *Deleted

## 2015-09-17 DIAGNOSIS — I480 Paroxysmal atrial fibrillation: Secondary | ICD-10-CM

## 2015-09-17 DIAGNOSIS — Z5181 Encounter for therapeutic drug level monitoring: Secondary | ICD-10-CM

## 2015-09-17 DIAGNOSIS — Z7901 Long term (current) use of anticoagulants: Secondary | ICD-10-CM | POA: Diagnosis not present

## 2015-09-17 DIAGNOSIS — I4891 Unspecified atrial fibrillation: Secondary | ICD-10-CM | POA: Diagnosis not present

## 2015-09-17 LAB — POCT INR: INR: 2

## 2015-10-14 DIAGNOSIS — H353132 Nonexudative age-related macular degeneration, bilateral, intermediate dry stage: Secondary | ICD-10-CM | POA: Diagnosis not present

## 2015-10-15 ENCOUNTER — Ambulatory Visit (INDEPENDENT_AMBULATORY_CARE_PROVIDER_SITE_OTHER): Payer: Medicare Other | Admitting: *Deleted

## 2015-10-15 ENCOUNTER — Other Ambulatory Visit: Payer: Self-pay | Admitting: Internal Medicine

## 2015-10-15 DIAGNOSIS — I4891 Unspecified atrial fibrillation: Secondary | ICD-10-CM | POA: Diagnosis not present

## 2015-10-15 DIAGNOSIS — Z7901 Long term (current) use of anticoagulants: Secondary | ICD-10-CM

## 2015-10-15 DIAGNOSIS — Z5181 Encounter for therapeutic drug level monitoring: Secondary | ICD-10-CM | POA: Diagnosis not present

## 2015-10-15 DIAGNOSIS — I480 Paroxysmal atrial fibrillation: Secondary | ICD-10-CM

## 2015-10-15 LAB — POCT INR: INR: 2.3

## 2015-11-12 ENCOUNTER — Ambulatory Visit (INDEPENDENT_AMBULATORY_CARE_PROVIDER_SITE_OTHER): Payer: Medicare Other | Admitting: *Deleted

## 2015-11-12 DIAGNOSIS — Z5181 Encounter for therapeutic drug level monitoring: Secondary | ICD-10-CM

## 2015-11-12 DIAGNOSIS — I4891 Unspecified atrial fibrillation: Secondary | ICD-10-CM | POA: Diagnosis not present

## 2015-11-12 DIAGNOSIS — Z7901 Long term (current) use of anticoagulants: Secondary | ICD-10-CM

## 2015-11-12 DIAGNOSIS — I480 Paroxysmal atrial fibrillation: Secondary | ICD-10-CM | POA: Diagnosis not present

## 2015-11-12 LAB — POCT INR: INR: 2.5

## 2015-11-18 ENCOUNTER — Other Ambulatory Visit: Payer: Self-pay | Admitting: Cardiology

## 2015-12-23 DIAGNOSIS — R531 Weakness: Secondary | ICD-10-CM | POA: Diagnosis not present

## 2015-12-23 DIAGNOSIS — I455 Other specified heart block: Secondary | ICD-10-CM | POA: Diagnosis not present

## 2015-12-23 DIAGNOSIS — I480 Paroxysmal atrial fibrillation: Secondary | ICD-10-CM | POA: Diagnosis not present

## 2015-12-23 DIAGNOSIS — I447 Left bundle-branch block, unspecified: Secondary | ICD-10-CM | POA: Diagnosis not present

## 2015-12-23 DIAGNOSIS — I088 Other rheumatic multiple valve diseases: Secondary | ICD-10-CM | POA: Diagnosis not present

## 2015-12-23 DIAGNOSIS — I495 Sick sinus syndrome: Secondary | ICD-10-CM | POA: Diagnosis not present

## 2015-12-23 DIAGNOSIS — R001 Bradycardia, unspecified: Secondary | ICD-10-CM | POA: Diagnosis not present

## 2015-12-23 DIAGNOSIS — I517 Cardiomegaly: Secondary | ICD-10-CM | POA: Diagnosis not present

## 2015-12-23 DIAGNOSIS — R0602 Shortness of breath: Secondary | ICD-10-CM | POA: Diagnosis not present

## 2015-12-23 DIAGNOSIS — N400 Enlarged prostate without lower urinary tract symptoms: Secondary | ICD-10-CM | POA: Diagnosis not present

## 2015-12-23 DIAGNOSIS — I272 Other secondary pulmonary hypertension: Secondary | ICD-10-CM | POA: Diagnosis not present

## 2015-12-23 DIAGNOSIS — I10 Essential (primary) hypertension: Secondary | ICD-10-CM | POA: Diagnosis not present

## 2015-12-23 DIAGNOSIS — Z7901 Long term (current) use of anticoagulants: Secondary | ICD-10-CM | POA: Diagnosis not present

## 2015-12-23 DIAGNOSIS — Z79899 Other long term (current) drug therapy: Secondary | ICD-10-CM | POA: Diagnosis not present

## 2015-12-23 DIAGNOSIS — I4891 Unspecified atrial fibrillation: Secondary | ICD-10-CM | POA: Diagnosis not present

## 2015-12-23 DIAGNOSIS — I442 Atrioventricular block, complete: Secondary | ICD-10-CM | POA: Diagnosis not present

## 2015-12-23 DIAGNOSIS — R9431 Abnormal electrocardiogram [ECG] [EKG]: Secondary | ICD-10-CM | POA: Diagnosis not present

## 2015-12-23 DIAGNOSIS — I209 Angina pectoris, unspecified: Secondary | ICD-10-CM | POA: Diagnosis not present

## 2017-07-26 DEATH — deceased
